# Patient Record
Sex: Female | Born: 1975 | Race: White | Hispanic: No | Marital: Single | Smoking: Current every day smoker
Health system: Southern US, Community
[De-identification: ages and names within clinical notes are randomized; demographics above are authoritative.]

## PROBLEM LIST (undated history)

## (undated) DIAGNOSIS — J301 Allergic rhinitis due to pollen: Secondary | ICD-10-CM

## (undated) DIAGNOSIS — F32A Depression, unspecified: Secondary | ICD-10-CM

## (undated) DIAGNOSIS — F509 Eating disorder, unspecified: Secondary | ICD-10-CM

## (undated) DIAGNOSIS — R011 Cardiac murmur, unspecified: Secondary | ICD-10-CM

## (undated) DIAGNOSIS — H409 Unspecified glaucoma: Secondary | ICD-10-CM

## (undated) DIAGNOSIS — G43909 Migraine, unspecified, not intractable, without status migrainosus: Secondary | ICD-10-CM

## (undated) DIAGNOSIS — F329 Major depressive disorder, single episode, unspecified: Secondary | ICD-10-CM

## (undated) DIAGNOSIS — K219 Gastro-esophageal reflux disease without esophagitis: Secondary | ICD-10-CM

## (undated) DIAGNOSIS — B019 Varicella without complication: Secondary | ICD-10-CM

## (undated) HISTORY — DX: Eating disorder, unspecified: F50.9

## (undated) HISTORY — DX: Cardiac murmur, unspecified: R01.1

## (undated) HISTORY — DX: Unspecified glaucoma: H40.9

## (undated) HISTORY — DX: Varicella without complication: B01.9

## (undated) HISTORY — DX: Depression, unspecified: F32.A

## (undated) HISTORY — DX: Gastro-esophageal reflux disease without esophagitis: K21.9

## (undated) HISTORY — DX: Major depressive disorder, single episode, unspecified: F32.9

## (undated) HISTORY — DX: Allergic rhinitis due to pollen: J30.1

## (undated) HISTORY — PX: EYE SURGERY: SHX253

## (undated) HISTORY — DX: Migraine, unspecified, not intractable, without status migrainosus: G43.909

---
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
## 2014-10-02 ENCOUNTER — Ambulatory Visit
Admission: RE | Admit: 2014-10-02 | Discharge: 2014-10-02 | Disposition: A | Discharge status: Home / Self Care | Payer: 59 | Source: Ambulatory Visit | Attending: Family Medicine | Admitting: Family Medicine

## 2014-10-02 ENCOUNTER — Other Ambulatory Visit: Payer: Self-pay | Admitting: Family Medicine

## 2014-10-02 DIAGNOSIS — M5489 Other dorsalgia: Secondary | ICD-10-CM
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
## 2015-05-24 ENCOUNTER — Emergency Department (HOSPITAL_COMMUNITY): Payer: Worker's Compensation

## 2015-05-24 ENCOUNTER — Emergency Department (HOSPITAL_COMMUNITY)
Admission: EM | Admit: 2015-05-24 | Discharge: 2015-05-25 | Disposition: A | Discharge status: Home / Self Care | Payer: Worker's Compensation | Attending: Emergency Medicine | Admitting: Emergency Medicine

## 2015-05-24 ENCOUNTER — Encounter (HOSPITAL_COMMUNITY): Payer: Self-pay | Admitting: *Deleted

## 2015-05-24 DIAGNOSIS — W11XXXA Fall on and from ladder, initial encounter: Secondary | ICD-10-CM | POA: Insufficient documentation

## 2015-05-24 DIAGNOSIS — S29002A Unspecified injury of muscle and tendon of back wall of thorax, initial encounter: Secondary | ICD-10-CM | POA: Insufficient documentation

## 2015-05-24 DIAGNOSIS — S4992XA Unspecified injury of left shoulder and upper arm, initial encounter: Secondary | ICD-10-CM | POA: Insufficient documentation

## 2015-05-24 DIAGNOSIS — Y999 Unspecified external cause status: Secondary | ICD-10-CM | POA: Diagnosis not present

## 2015-05-24 DIAGNOSIS — Z720 Tobacco use: Secondary | ICD-10-CM | POA: Diagnosis not present

## 2015-05-24 DIAGNOSIS — M25512 Pain in left shoulder: Secondary | ICD-10-CM

## 2015-05-24 DIAGNOSIS — Y939 Activity, unspecified: Secondary | ICD-10-CM | POA: Diagnosis not present

## 2015-05-24 DIAGNOSIS — S99911A Unspecified injury of right ankle, initial encounter: Secondary | ICD-10-CM | POA: Insufficient documentation

## 2015-05-24 DIAGNOSIS — Z79899 Other long term (current) drug therapy: Secondary | ICD-10-CM | POA: Insufficient documentation

## 2015-05-24 DIAGNOSIS — M25571 Pain in right ankle and joints of right foot: Secondary | ICD-10-CM

## 2015-05-24 DIAGNOSIS — M62830 Muscle spasm of back: Secondary | ICD-10-CM | POA: Diagnosis not present

## 2015-05-24 DIAGNOSIS — Y929 Unspecified place or not applicable: Secondary | ICD-10-CM | POA: Diagnosis not present

## 2015-05-24 DIAGNOSIS — M546 Pain in thoracic spine: Secondary | ICD-10-CM

## 2015-05-24 DIAGNOSIS — S0990XA Unspecified injury of head, initial encounter: Secondary | ICD-10-CM | POA: Diagnosis present

## 2015-05-24 IMAGING — CR DG ANKLE COMPLETE 3+V*R*
3 series · 3 of 3 positions shown · non-contrast
Comparison: None.

CLINICAL DATA: Fall, twisted right ankle

EXAM:
RIGHT ANKLE - COMPLETE 3+ VIEW

[x ankle ap right]
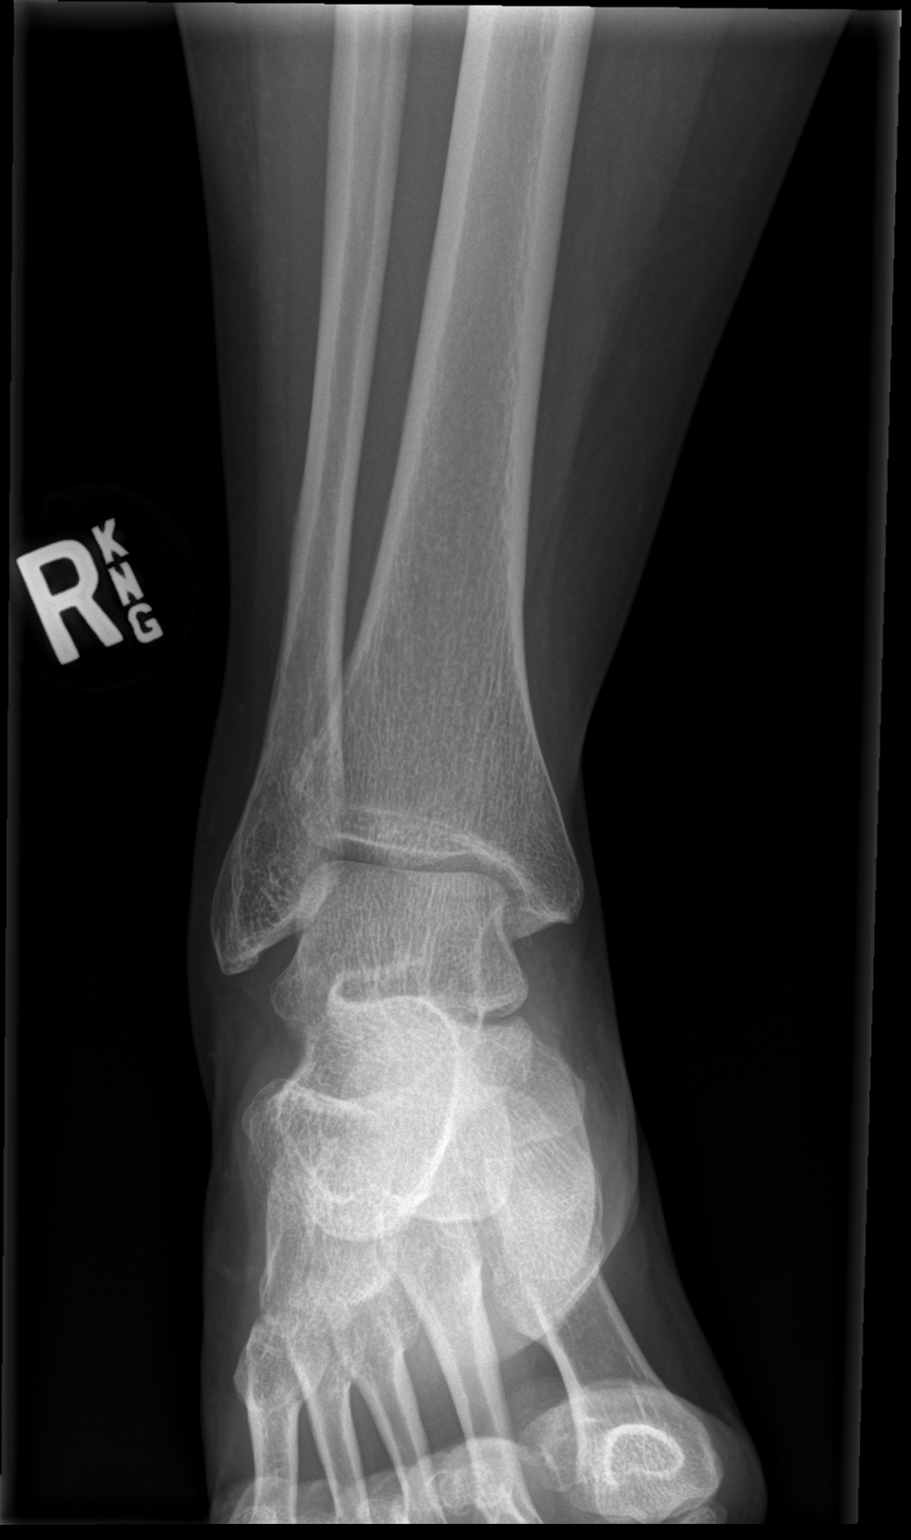

[x ankle obl right]
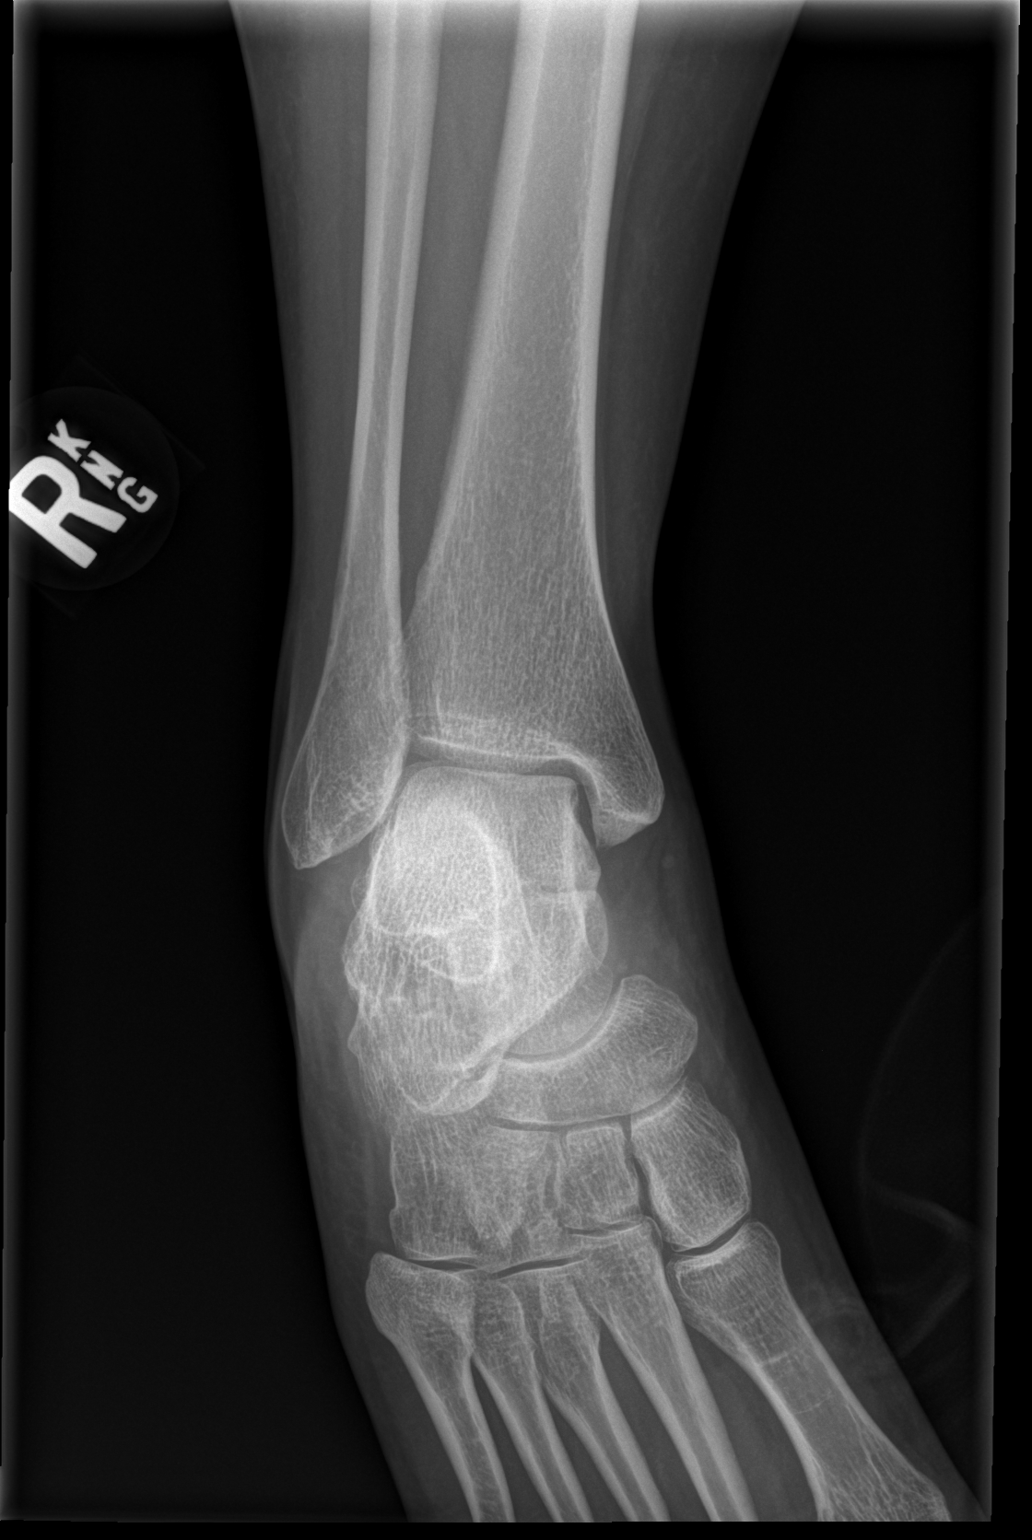

[x ankle lat right]
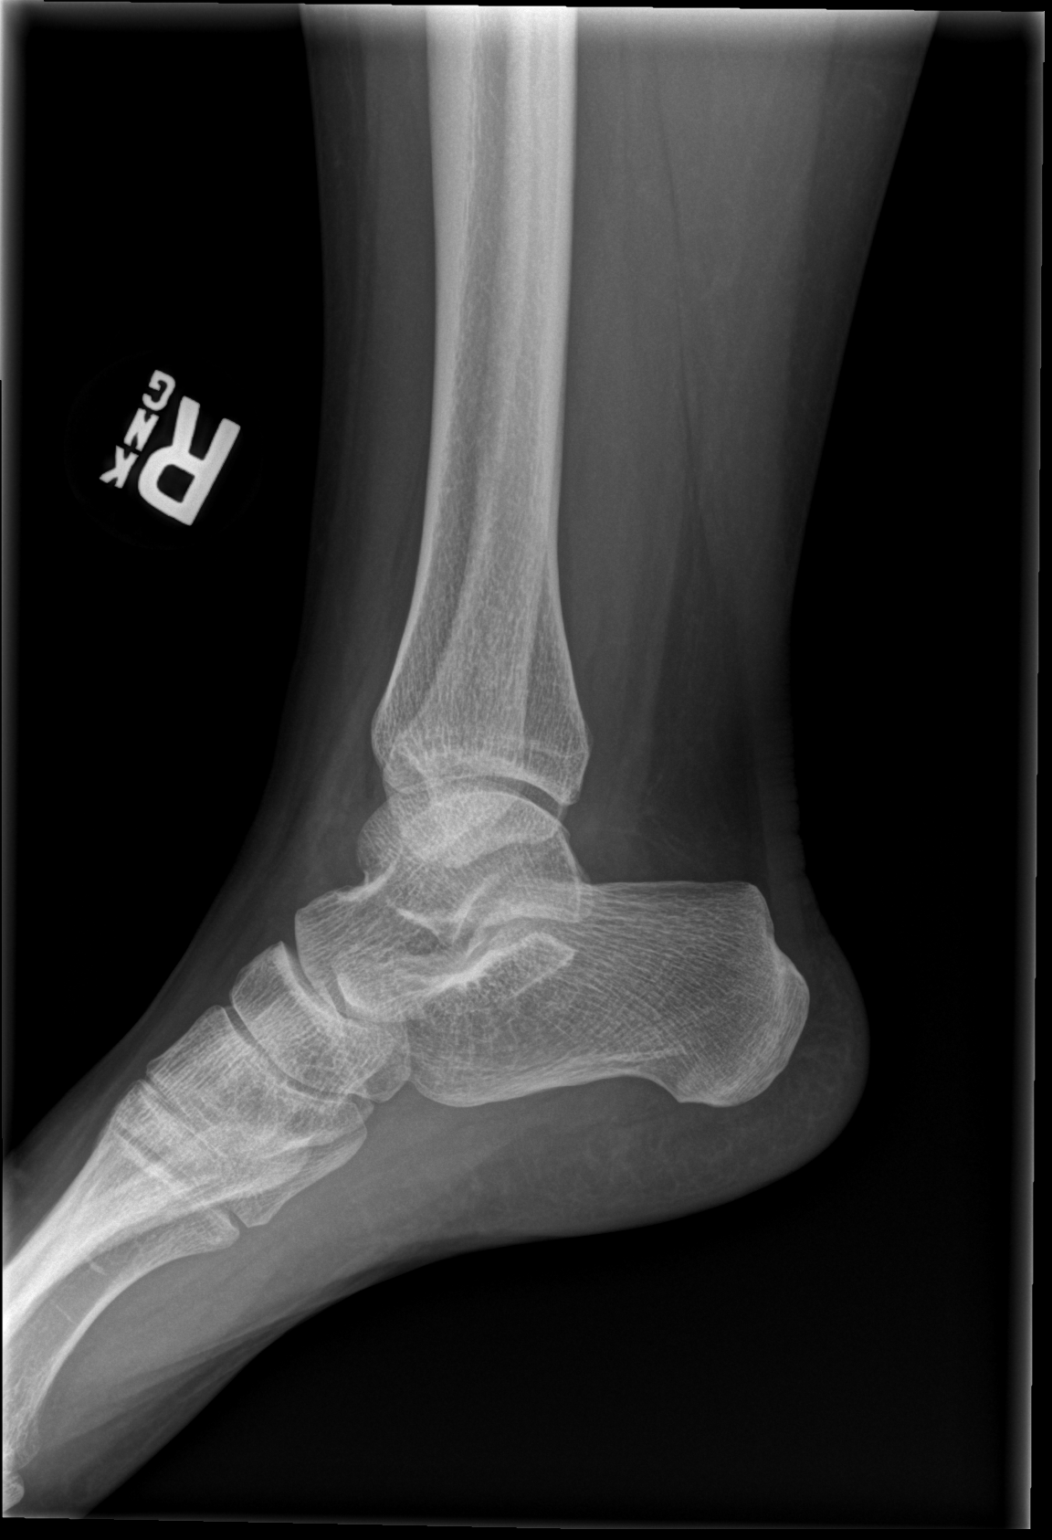

[3 of 3 positions shown; findings below may reference images not displayed]

FINDINGS: No fracture or dislocation is seen.

The ankle mortise is intact.

The base of the fifth metatarsal is unremarkable.

The visualized soft tissues are unremarkable.
IMPRESSION: No fracture or dislocation is seen.

## 2015-05-24 IMAGING — CT CT CERVICAL SPINE W/O CM
4 of 6 series · 13 of 33 positions shown, 15 images · non-contrast
Comparison: None.

CLINICAL DATA: Fall, neck pain

EXAM:
CT HEAD WITHOUT CONTRAST
CT CERVICAL SPINE WITHOUT CONTRAST
TECHNIQUE: Multidetector CT imaging of the head and cervical spine was
performed following the standard protocol without intravenous
contrast. Multiplanar CT image reconstructions of the cervical spine
were also generated.

[Series 3: c-spine st · axial · 0.29mm/px · z∈[-233,-181]mm · 2 of 79 slices shown]
[im 27/79  bone]
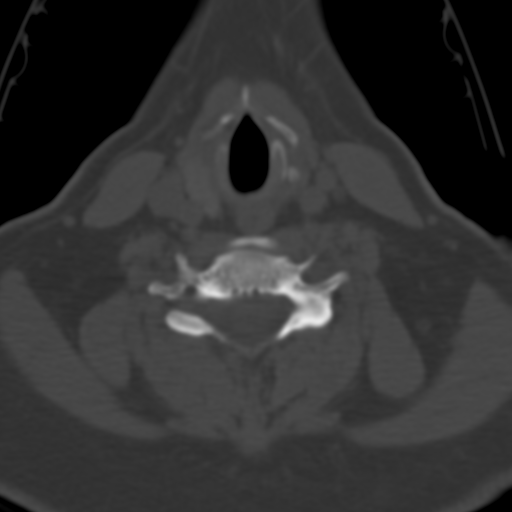
[im 53/79  bone]
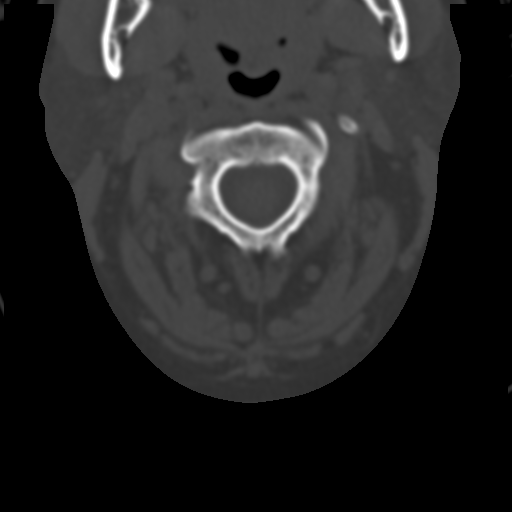

[Series 8: axial recon · axial · 0.23mm/px · z∈[-271,-193]mm · 3 of 85 slices shown, 4 images]
[im 22/85  soft-tissue]
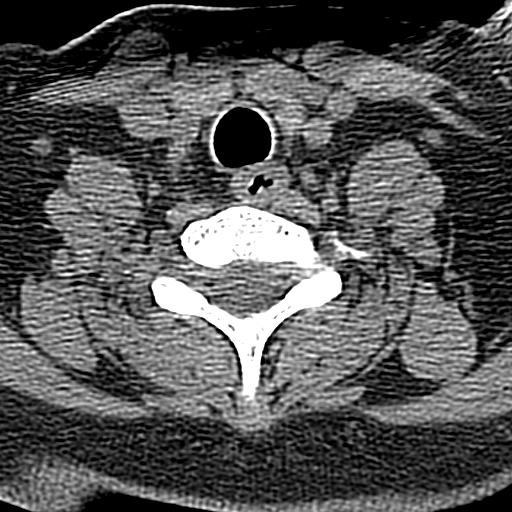
[im 22/85  bone]
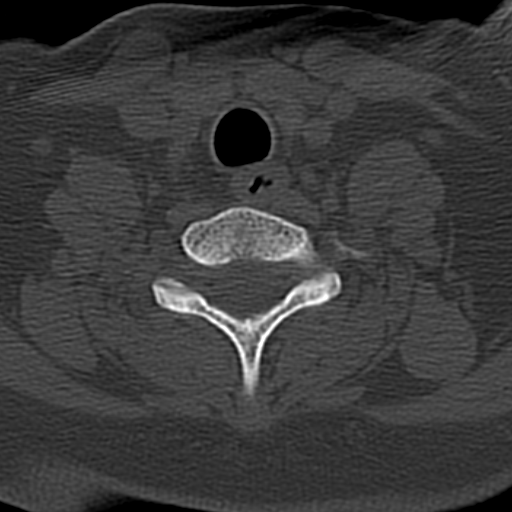
[im 43/85  bone]
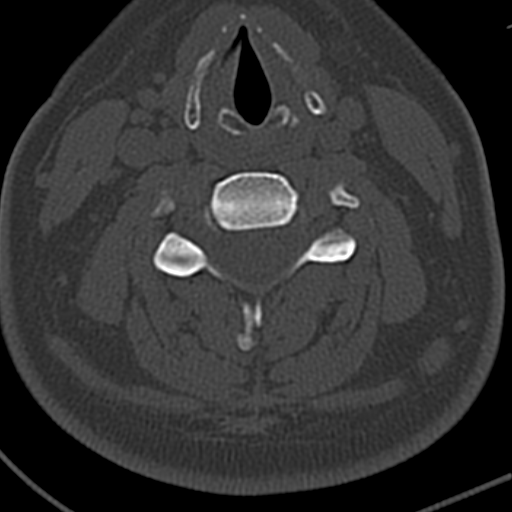
[im 64/85  bone]
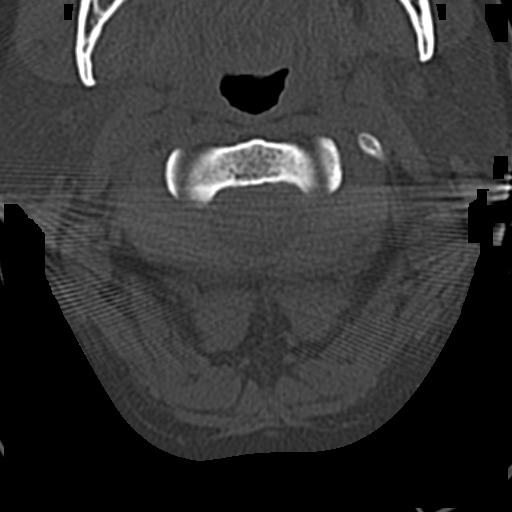

[Series 9: coronal · coronal · 0.25mm/px · 3 of 61 slices shown]
[im 13/61  bone]
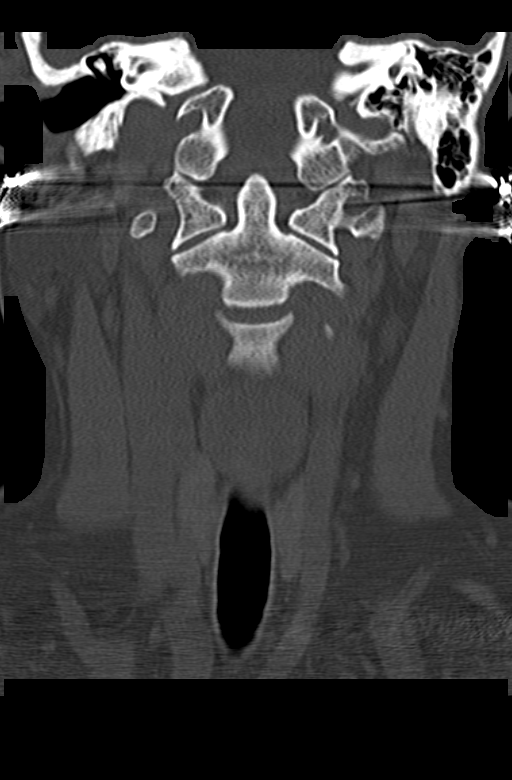
[im 25/61  bone]
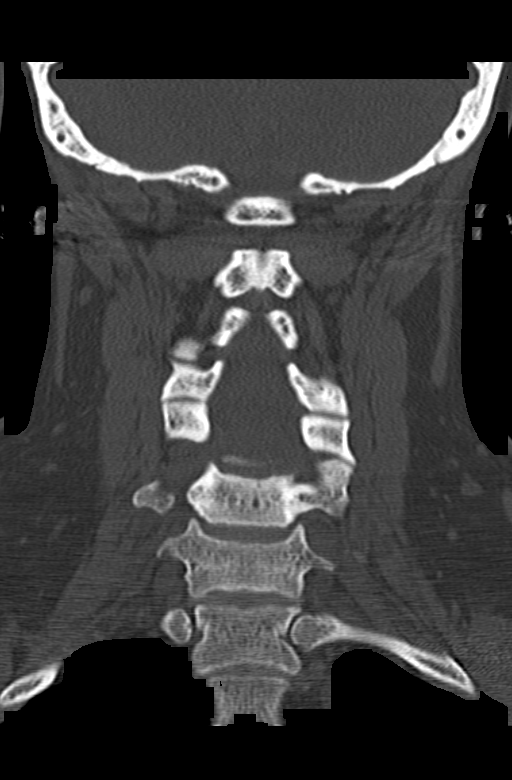
[im 37/61  bone]
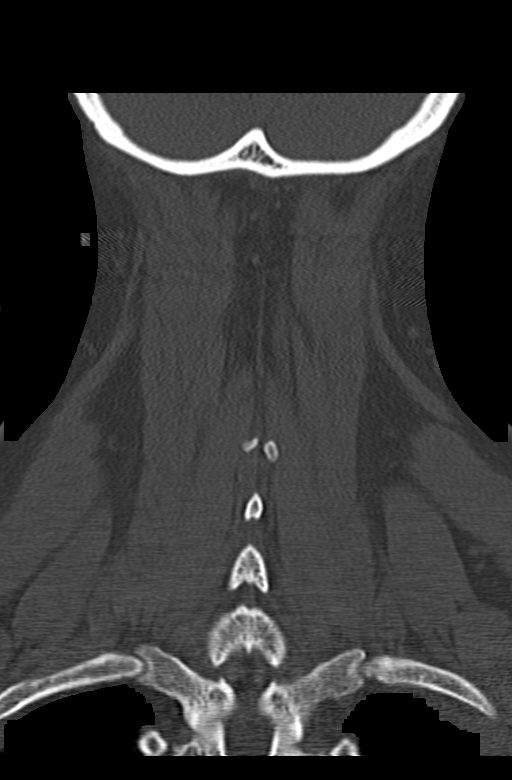

[Series 10: sagittal · sagittal · 0.26mm/px · 5 of 61 slices shown, 6 images]
[im 21/61  bone]
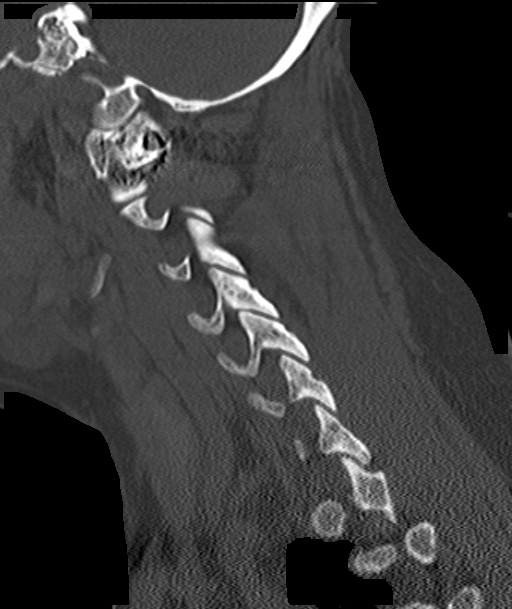
[im 26/61  bone]
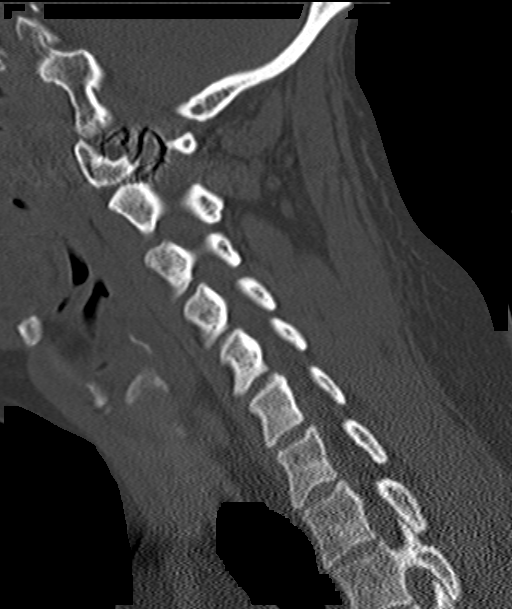
[im 31/61  soft-tissue]
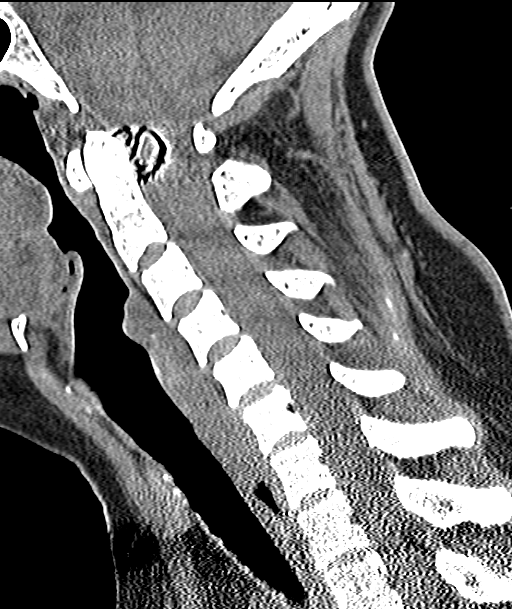
[im 31/61  bone]
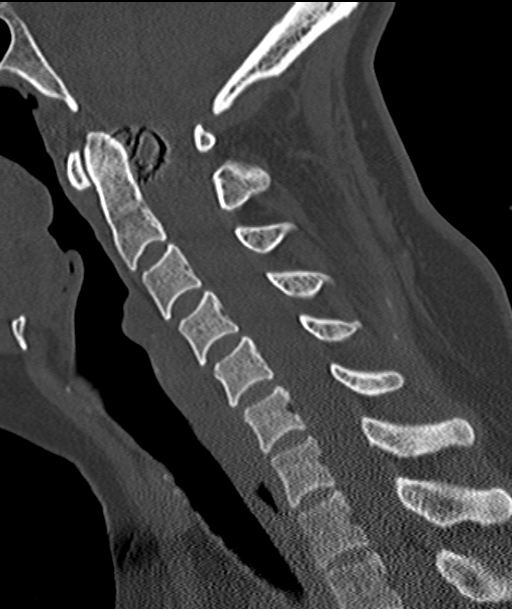
[im 36/61  bone]
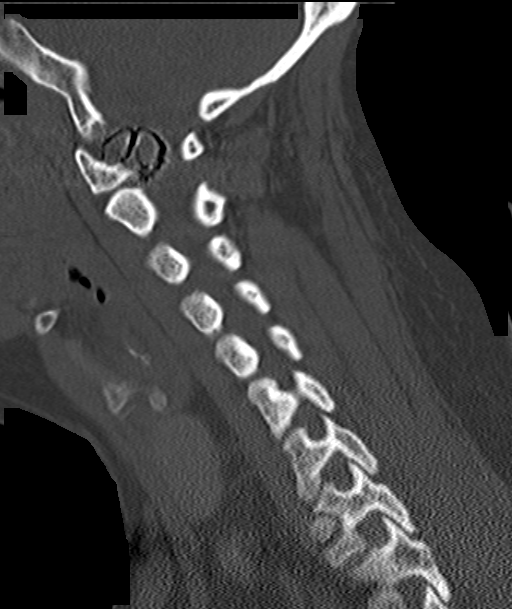
[im 41/61  bone]
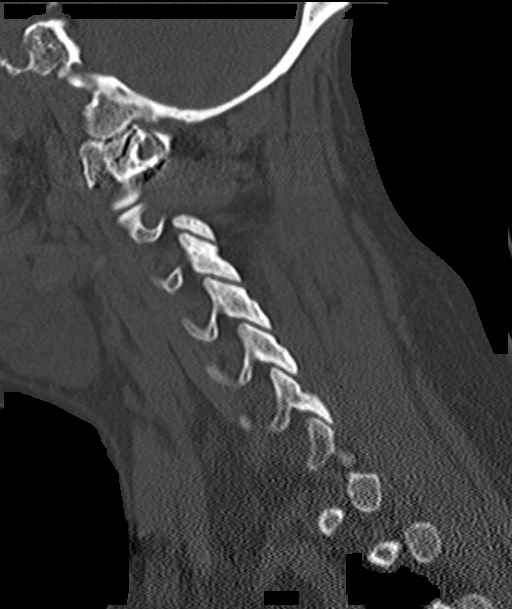

[13 of 33 positions shown; findings below may reference images not displayed]

FINDINGS: CT HEAD FINDINGS

No evidence of parenchymal hemorrhage or extra-axial fluid
collection. No mass lesion, mass effect, or midline shift.

No CT evidence of acute infarction.

Cerebral volume is within normal limits.  No ventriculomegaly.

Right globe prosthesis.

The visualized paranasal sinuses are essentially clear. The mastoid
air cells are unopacified.

No evidence of calvarial fracture.

CT CERVICAL SPINE FINDINGS

Straightening of the cervical spine.

No evidence of fracture or dislocation. Vertebral body heights and
intervertebral disc spaces are maintained. Dens appears intact.

No prevertebral soft tissue swelling.

Visualized thyroid is unremarkable.

Visualized lung apices are clear.
IMPRESSION: Right globe prosthesis.  Otherwise negative head CT.

Normal cervical spine CT.

## 2015-05-24 IMAGING — CR DG SHOULDER 2+V*L*
3 series · 3 of 3 positions shown · non-contrast
Comparison: None.

CLINICAL DATA: Fall

EXAM:
LEFT SHOULDER - 2+ VIEW

[x shoulder ap left (1 of 2)]
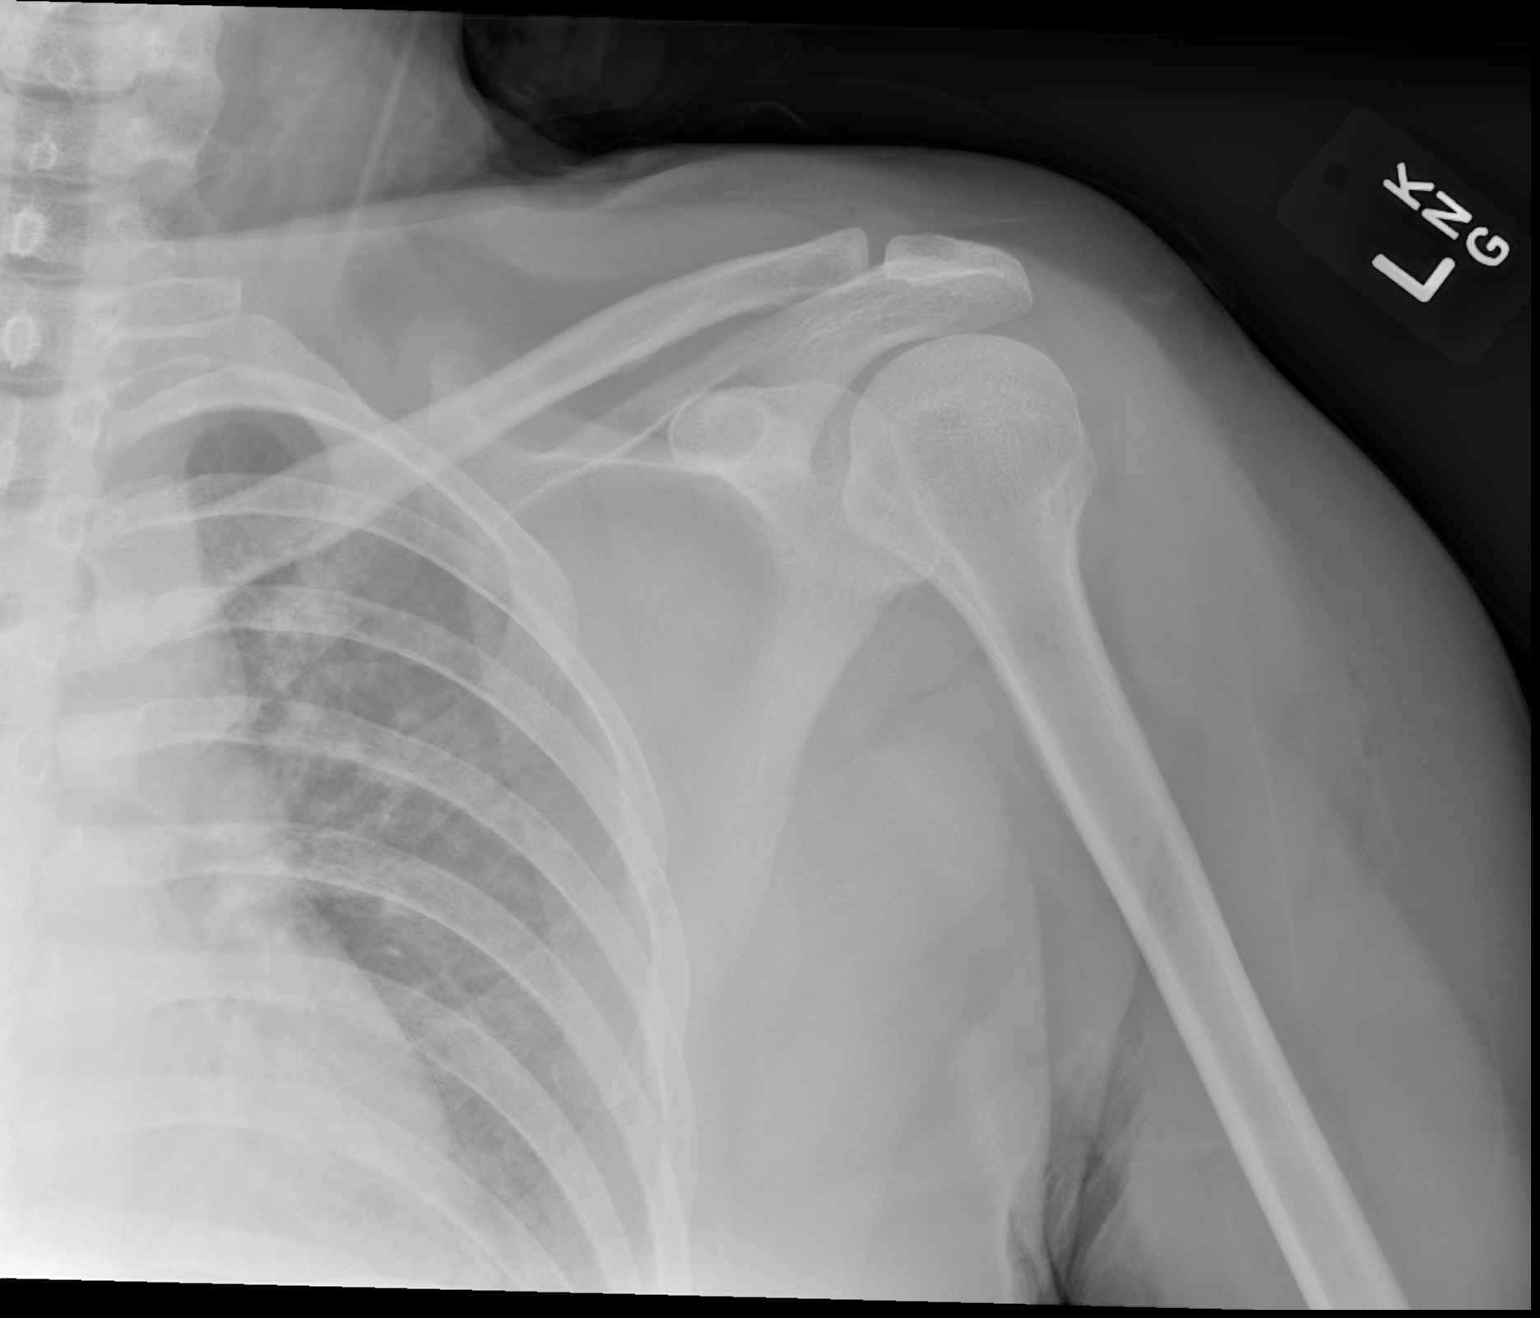

[x shoulder ap left (2 of 2)]
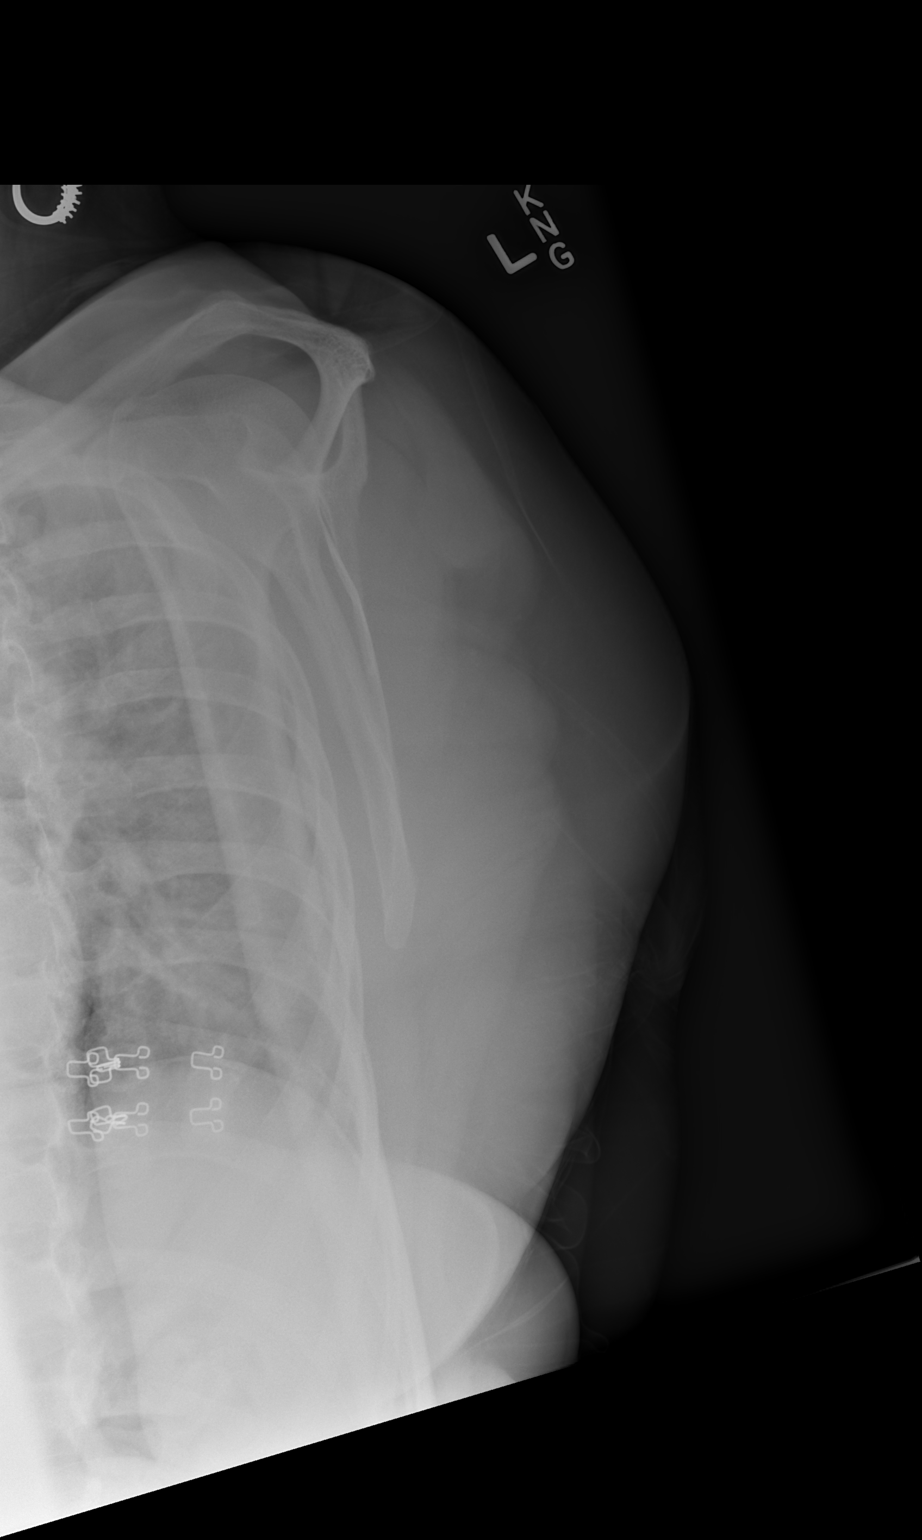

[x shoulder axillary left]
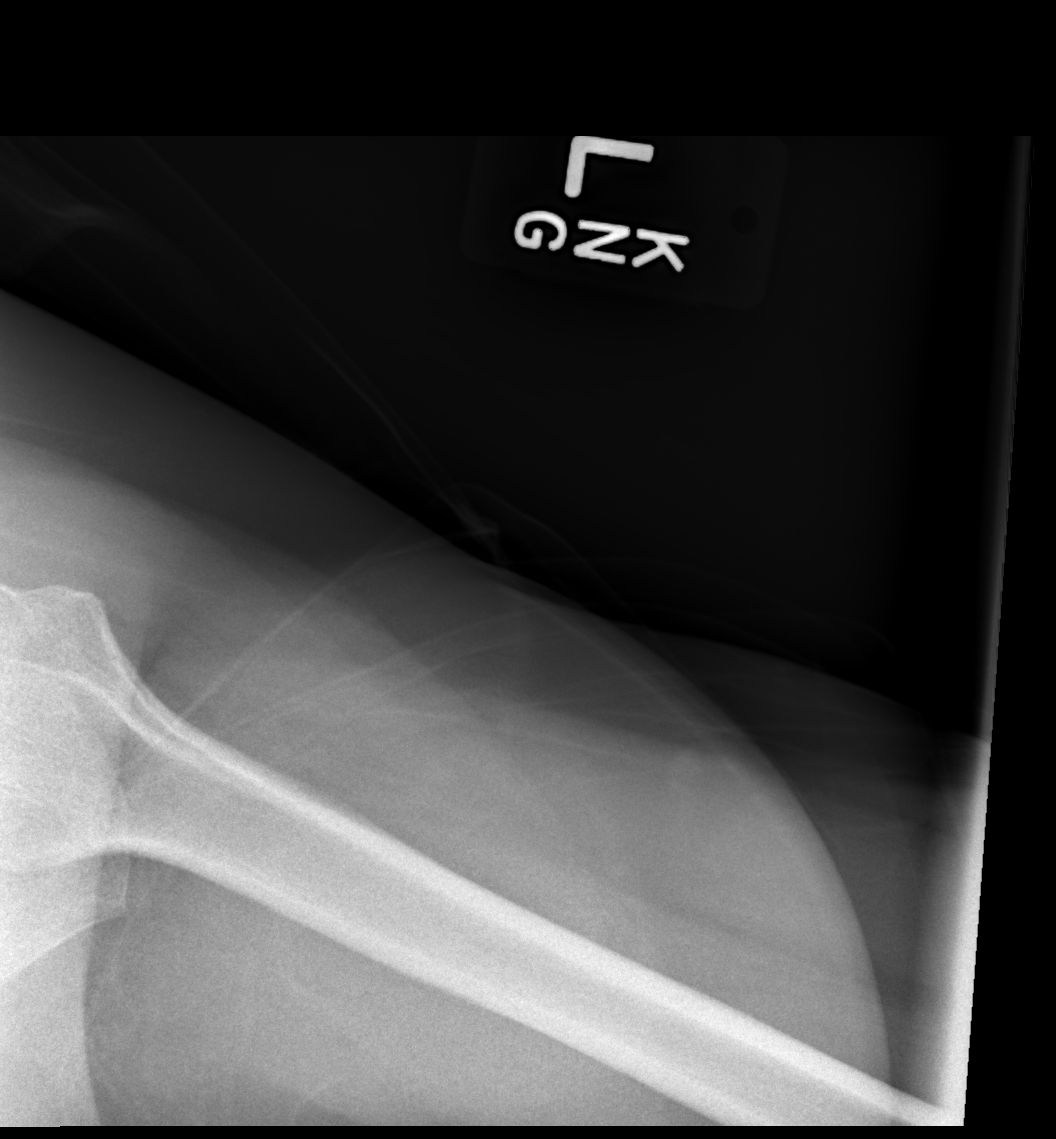

[3 of 3 positions shown; findings below may reference images not displayed]

FINDINGS: No fracture or dislocation is seen.

The joint spaces are preserved.

The visualized soft tissues are unremarkable.

Visualized left lung is essentially clear.
IMPRESSION: No fracture or dislocation is seen.

## 2015-05-24 MED ORDER — MORPHINE SULFATE 4 MG/ML IJ SOLN
4.0000 mg | Freq: Once | INTRAMUSCULAR | Status: AC
  Administered 2015-05-24: 4 mg via INTRAMUSCULAR
  Filled 2015-05-24: qty 1
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
## 2015-05-24 NOTE — ED Notes (Signed)
Bed: [facility] Expected date:  Expected time:  Means of arrival:  Comments: EMS/2F/fall/LSB

## 2015-05-24 NOTE — ED Notes (Signed)
Pt fell backwards when she misstepped about a foot. Pt with complaints of a "twisted right ankle and lt. Hip pain" to EMS. Pt was at work when she fell. Pt arrived to the ER on a back board and with cervical collar in place. Pt placed on stretcher by RN, NT x2 and ortho tech. During RN exam at transfer off of backboard, pt complained of cervical and mid back pain.

## 2015-05-24 NOTE — ED Notes (Addendum)
Pt states that she has pain to the neck, mid back and left arm, lt hip and right ankle.

## 2015-05-25 ENCOUNTER — Emergency Department (HOSPITAL_COMMUNITY): Payer: Worker's Compensation

## 2015-05-25 IMAGING — CR DG THORACIC SPINE 2V
2 series · 2 of 2 positions shown · non-contrast
Comparison: None.

CLINICAL DATA: Fall, back pain

EXAM:
THORACIC SPINE - 2 VIEW

[t thoracic spine ap]
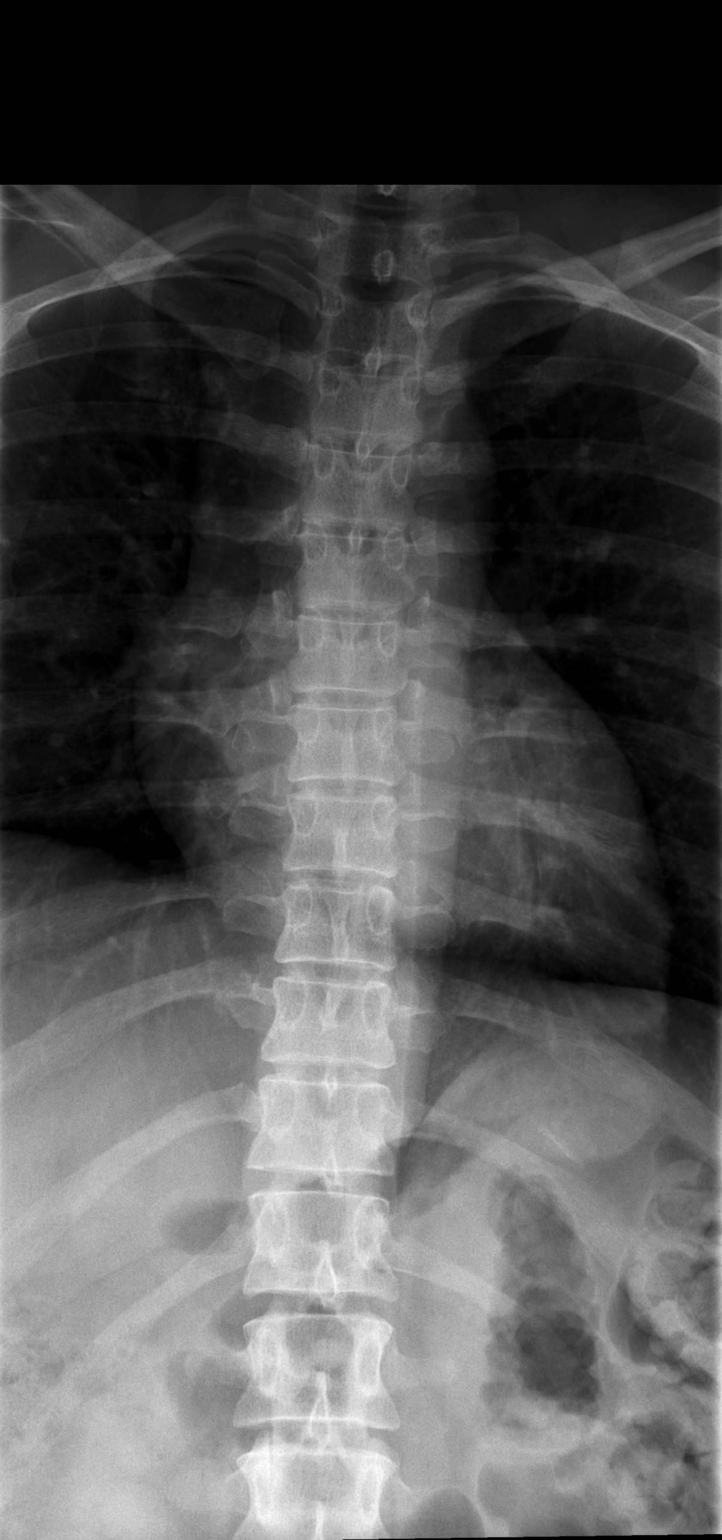

[t thoracic spine lat]
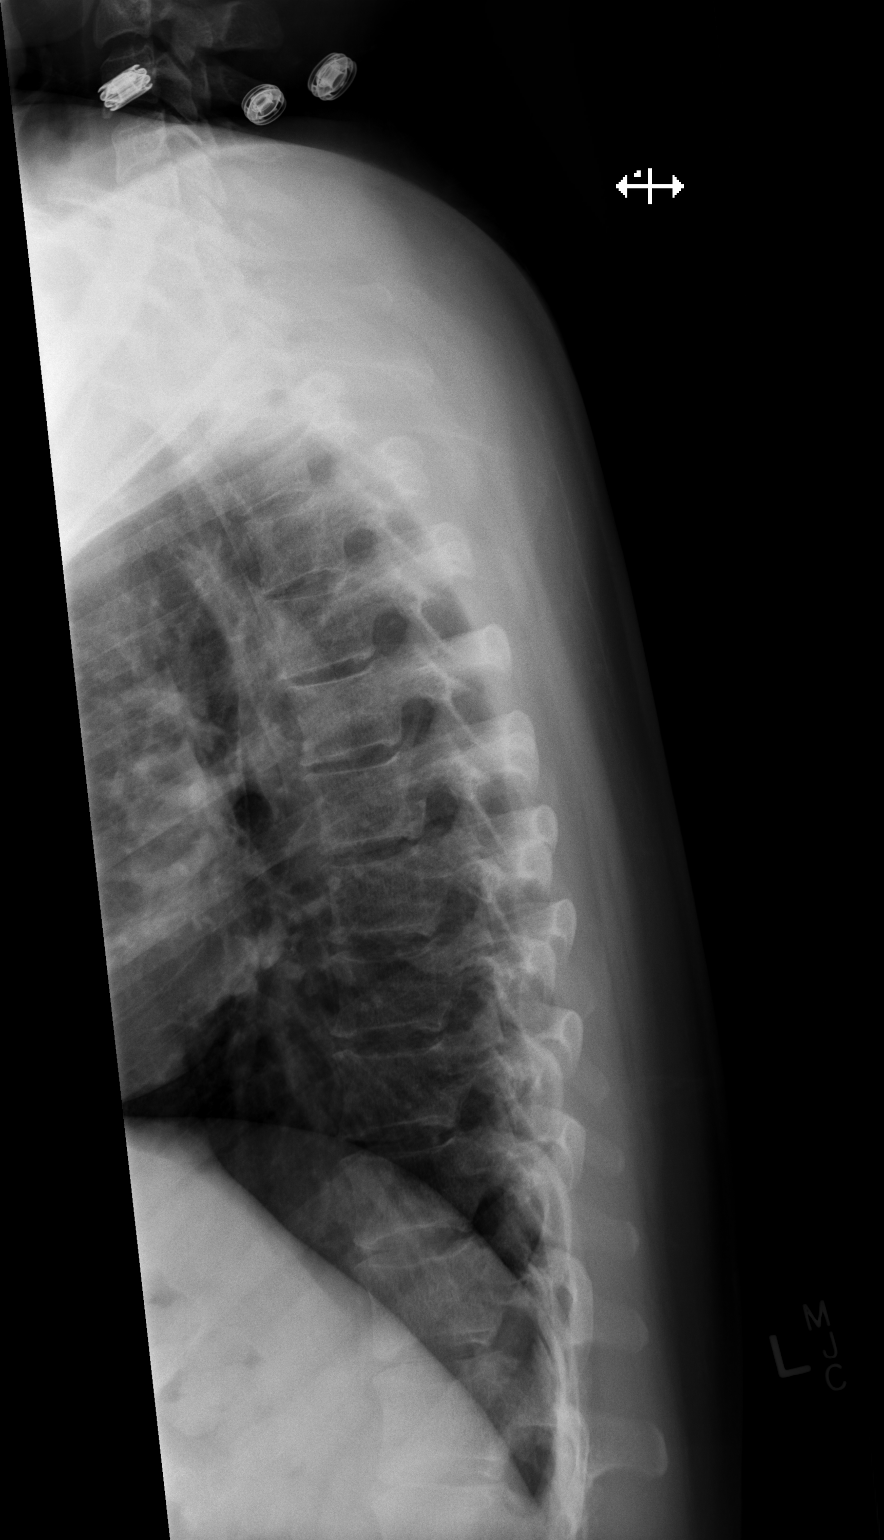

[2 of 2 positions shown; findings below may reference images not displayed]

FINDINGS: Normal thoracic kyphosis.

No evidence of fracture or dislocation. Vertebral body heights and
intervertebral disc spaces are maintained.

Visualized lungs are clear.
IMPRESSION: No fracture or dislocation is seen.

## 2015-05-25 MED ORDER — HYDROCODONE-ACETAMINOPHEN 5-325 MG PO TABS: 2.0000 | ORAL_TABLET | ORAL | Status: DC | PRN

## 2015-05-25 MED ORDER — DIAZEPAM 2 MG PO TABS
2.0000 mg | ORAL_TABLET | Freq: Once | ORAL | Status: AC
  Administered 2015-05-25: 2 mg via ORAL
  Filled 2015-05-25: qty 1

## 2015-05-25 MED ORDER — DIAZEPAM 5 MG/ML IJ SOLN: 2.5000 mg | Freq: Once | INTRAMUSCULAR | Status: DC

## 2015-05-25 MED ORDER — IBUPROFEN 800 MG PO TABS: 800.0000 mg | ORAL_TABLET | Freq: Three times a day (TID) | ORAL | Status: AC

## 2015-05-25 MED ORDER — CYCLOBENZAPRINE HCL 10 MG PO TABS: 10.0000 mg | ORAL_TABLET | Freq: Two times a day (BID) | ORAL | Status: DC | PRN

## 2015-05-25 NOTE — ED Notes (Signed)
Pt was given morphine prior to giving urine specimen for her drug screening.

## 2015-05-25 NOTE — Discharge Instructions (Signed)
Ankle Pain  Ankle pain is a common symptom. The bones, cartilage, tendons, and muscles of the ankle joint perform a lot of work each day. The ankle joint holds your body weight and allows you to move around. Ankle pain can occur on either side or back of 1 or both ankles. Ankle pain may be sharp and burning or dull and aching. There may be tenderness, stiffness, redness, or warmth around the ankle. The pain occurs more often when a person walks or puts pressure on the ankle.  CAUSES   There are many reasons ankle pain can develop. It is important to work with your caregiver to identify the cause since many conditions can impact the bones, cartilage, muscles, and tendons. Causes for ankle pain include:   Injury, including a break (fracture), sprain, or strain often due to a fall, sports, or a high-impact activity.   Swelling (inflammation) of a tendon (tendonitis).   Achilles tendon rupture.   Ankle instability after repeated sprains and strains.   Poor foot alignment.   Pressure on a nerve (tarsal tunnel syndrome).   Arthritis in the ankle or the lining of the ankle.   Crystal formation in the ankle (gout or pseudogout).  DIAGNOSIS   A diagnosis is based on your medical history, your symptoms, results of your physical exam, and results of diagnostic tests. Diagnostic tests may include X-ray exams or a computerized magnetic scan (magnetic resonance imaging, MRI).  TREATMENT   Treatment will depend on the cause of your ankle pain and may include:   Keeping pressure off the ankle and limiting activities.   Using crutches or other walking support (a cane or brace).   Using rest, ice, compression, and elevation.   Participating in physical therapy or home exercises.   Wearing shoe inserts or special shoes.   Losing weight.   Taking medications to reduce pain or swelling or receiving an injection.   Undergoing surgery.  HOME CARE INSTRUCTIONS    Only take over-the-counter or prescription medicines for  pain, discomfort, or fever as directed by your caregiver.   Put ice on the injured area.   Put ice in a plastic bag.   Place a towel between your skin and the bag.   Leave the ice on for 15-20 minutes at a time, 03-04 times a day.   Keep your leg raised (elevated) when possible to lessen swelling.   Avoid activities that cause ankle pain.   Follow specific exercises as directed by your caregiver.   Record how often you have ankle pain, the location of the pain, and what it feels like. This information may be helpful to you and your caregiver.   Ask your caregiver about returning to work or sports and whether you should drive.   Follow up with your caregiver for further examination, therapy, or testing as directed.  SEEK MEDICAL CARE IF:    Pain or swelling continues or worsens beyond 1 week.   You have an oral temperature above 102 F (38.9 C).   You are feeling unwell or have chills.   You are having an increasingly difficult time with walking.   You have loss of sensation or other new symptoms.   You have questions or concerns.  MAKE SURE YOU:    Understand these instructions.   Will watch your condition.   Will get help right away if you are not doing well or get worse.  Document Released: 05/06/2010 Document Revised: 02/08/2012 Document Reviewed: 05/06/2010  ExitCare   Patient Information 2015 [employer]. This information is not intended to replace advice given to you by your health care provider. Make sure you discuss any questions you have with your health care provider.  Shoulder Pain  The shoulder is the joint that connects your arms to your body. The bones that form the shoulder joint include the upper arm bone (humerus), the shoulder blade (scapula), and the collarbone (clavicle). The top of the humerus is shaped like a ball and fits into a rather flat socket on the scapula (glenoid cavity). A combination of muscles and strong, fibrous tissues that connect muscles to bones (tendons)  support your shoulder joint and hold the ball in the socket. Small, fluid-filled sacs (bursae) are located in different areas of the joint. They act as cushions between the bones and the overlying soft tissues and help reduce friction between the gliding tendons and the bone as you move your arm. Your shoulder joint allows a wide range of motion in your arm. This range of motion allows you to do things like scratch your back or throw a ball. However, this range of motion also makes your shoulder more prone to pain from overuse and injury.  Causes of shoulder pain can originate from both injury and overuse and usually can be grouped in the following four categories:   Redness, swelling, and pain (inflammation) of the tendon (tendinitis) or the bursae (bursitis).   Instability, such as a dislocation of the joint.   Inflammation of the joint (arthritis).   Broken bone (fracture).  HOME CARE INSTRUCTIONS    Apply ice to the sore area.   Put ice in a plastic bag.   Place a towel between your skin and the bag.   Leave the ice on for 15-20 minutes, 3-4 times per day for the first 2 days, or as directed by your health care provider.   Stop using cold packs if they do not help with the pain.   If you have a shoulder sling or immobilizer, wear it as long as your caregiver instructs. Only remove it to shower or bathe. Move your arm as little as possible, but keep your hand moving to prevent swelling.   Squeeze a soft ball or foam pad as much as possible to help prevent swelling.   Only take over-the-counter or prescription medicines for pain, discomfort, or fever as directed by your caregiver.  SEEK MEDICAL CARE IF:    Your shoulder pain increases, or new pain develops in your arm, hand, or fingers.   Your hand or fingers become cold and numb.   Your pain is not relieved with medicines.  SEEK IMMEDIATE MEDICAL CARE IF:    Your arm, hand, or fingers are numb or tingling.   Your arm, hand, or fingers are  significantly swollen or turn white or blue.  MAKE SURE YOU:    Understand these instructions.   Will watch your condition.   Will get help right away if you are not doing well or get worse.  Document Released: 08/26/2005 Document Revised: 04/02/2014 Document Reviewed: 10/31/2011  ExitCare Patient Information 2015 [employer]. This information is not intended to replace advice given to you by your health care provider. Make sure you discuss any questions you have with your health care provider.

## 2015-05-25 NOTE — ED Notes (Signed)
Unable to complete urine drug screen on pt as we do not have the supplies necessary to preform test.  Pt will be given valium prior to departure.  If pt has drug screening done tomorrow pt will be positive for benzodiazepines and opiates d/t medications given in ED tonight.

## 2015-12-19 ENCOUNTER — Other Ambulatory Visit (HOSPITAL_COMMUNITY)
Admission: RE | Admit: 2015-12-19 | Discharge: 2015-12-19 | Disposition: A | Discharge status: Home / Self Care | Payer: Self-pay | Source: Ambulatory Visit | Attending: Family Medicine | Admitting: Family Medicine

## 2015-12-19 ENCOUNTER — Other Ambulatory Visit: Payer: Self-pay | Admitting: Family Medicine

## 2015-12-19 DIAGNOSIS — Z124 Encounter for screening for malignant neoplasm of cervix: Secondary | ICD-10-CM | POA: Insufficient documentation

## 2015-12-23 LAB — CYTOLOGY - PAP

## 2017-07-27 DIAGNOSIS — R109 Unspecified abdominal pain: Secondary | ICD-10-CM | POA: Diagnosis not present

## 2017-07-27 DIAGNOSIS — G43909 Migraine, unspecified, not intractable, without status migrainosus: Secondary | ICD-10-CM | POA: Diagnosis not present

## 2017-07-27 DIAGNOSIS — E7800 Pure hypercholesterolemia, unspecified: Secondary | ICD-10-CM | POA: Diagnosis not present

## 2017-08-19 DIAGNOSIS — G43909 Migraine, unspecified, not intractable, without status migrainosus: Secondary | ICD-10-CM | POA: Diagnosis not present

## 2017-09-24 DIAGNOSIS — S060X0D Concussion without loss of consciousness, subsequent encounter: Secondary | ICD-10-CM | POA: Diagnosis not present

## 2017-09-24 DIAGNOSIS — Z23 Encounter for immunization: Secondary | ICD-10-CM | POA: Diagnosis not present

## 2018-01-25 DIAGNOSIS — R51 Headache: Secondary | ICD-10-CM | POA: Diagnosis not present

## 2018-01-25 DIAGNOSIS — N926 Irregular menstruation, unspecified: Secondary | ICD-10-CM | POA: Diagnosis not present

## 2018-01-28 ENCOUNTER — Telehealth: Payer: Self-pay
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
## 2018-02-01 ENCOUNTER — Other Ambulatory Visit (INDEPENDENT_AMBULATORY_CARE_PROVIDER_SITE_OTHER): Payer: BLUE CROSS/BLUE SHIELD

## 2018-02-01 ENCOUNTER — Ambulatory Visit: Payer: BLUE CROSS/BLUE SHIELD | Admitting: Family Medicine

## 2018-02-01 ENCOUNTER — Encounter: Payer: Self-pay | Admitting: Family Medicine

## 2018-02-01 VITALS — BP 130/102 | HR 65 | Ht 60.25 in | Wt 145.0 lb

## 2018-02-01 DIAGNOSIS — M2550 Pain in unspecified joint: Secondary | ICD-10-CM

## 2018-02-01 DIAGNOSIS — R519 Headache, unspecified: Secondary | ICD-10-CM | POA: Insufficient documentation

## 2018-02-01 DIAGNOSIS — G8929 Other chronic pain: Secondary | ICD-10-CM | POA: Insufficient documentation

## 2018-02-01 DIAGNOSIS — R51 Headache: Secondary | ICD-10-CM | POA: Diagnosis not present

## 2018-02-01 LAB — CBC WITH DIFFERENTIAL/PLATELET
Basophils Absolute: 0 10*3/uL (ref 0.0–0.1)
Basophils Relative: 0.5 % (ref 0.0–3.0)
Eosinophils Absolute: 0.1 10*3/uL (ref 0.0–0.7)
Eosinophils Relative: 2 % (ref 0.0–5.0)
HCT: 41.4 % (ref 36.0–46.0)
Hemoglobin: 13.8 g/dL (ref 12.0–15.0)
Lymphocytes Relative: 31.5 % (ref 12.0–46.0)
Lymphs Abs: 2 10*3/uL (ref 0.7–4.0)
MCHC: 33.2 g/dL (ref 30.0–36.0)
MCV: 95.5 fl (ref 78.0–100.0)
Monocytes Absolute: 0.6 10*3/uL (ref 0.1–1.0)
Monocytes Relative: 8.5 % (ref 3.0–12.0)
Neutro Abs: 3.7 10*3/uL (ref 1.4–7.7)
Neutrophils Relative %: 57.5 % (ref 43.0–77.0)
Platelets: 251 10*3/uL (ref 150.0–400.0)
RBC: 4.34 Mil/uL (ref 3.87–5.11)
RDW: 13.6 % (ref 11.5–15.5)
WBC: 6.5 10*3/uL (ref 4.0–10.5)

## 2018-02-01 LAB — VITAMIN B12: Vitamin B-12: 220 pg/mL (ref 211–911)

## 2018-02-01 LAB — COMPREHENSIVE METABOLIC PANEL
ALT: 18 U/L (ref 0–35)
AST: 17 U/L (ref 0–37)
Albumin: 4.5 g/dL (ref 3.5–5.2)
Alkaline Phosphatase: 68 U/L (ref 39–117)
BUN: 13 mg/dL (ref 6–23)
CO2: 22 mEq/L (ref 19–32)
Calcium: 9.4 mg/dL (ref 8.4–10.5)
Chloride: 107 mEq/L (ref 96–112)
Creatinine, Ser: 0.74 mg/dL (ref 0.40–1.20)
GFR: 90.96 mL/min (ref >60.00)
Glucose, Bld: 91 mg/dL (ref 70–99)
Potassium: 3.6 mEq/L (ref 3.5–5.1)
Sodium: 137 mEq/L (ref 135–145)
Total Bilirubin: 0.5 mg/dL (ref 0.2–1.2)
Total Protein: 7.7 g/dL (ref 6.0–8.3)

## 2018-02-01 LAB — C-REACTIVE PROTEIN: CRP: 0.4 mg/dL — ABNORMAL LOW (ref 0.5–20.0)

## 2018-02-01 LAB — FOLATE: Folate: 13.5 ng/mL (ref >5.9)

## 2018-02-01 LAB — VITAMIN D 25 HYDROXY (VIT D DEFICIENCY, FRACTURES): VITD: 10.96 ng/mL — ABNORMAL LOW (ref 30.00–100.00)

## 2018-02-01 LAB — IBC PANEL
Iron: 83 ug/dL (ref 42–145)
Saturation Ratios: 22 % (ref 20.0–50.0)
Transferrin: 269 mg/dL (ref 212.0–360.0)

## 2018-02-01 LAB — URIC ACID: Uric Acid, Serum: 4 mg/dL (ref 2.4–7.0)

## 2018-02-01 LAB — SEDIMENTATION RATE: Sed Rate: 17 mm/hr (ref 0–20)

## 2018-02-01 LAB — TESTOSTERONE: Testosterone: 49.46 ng/dL — ABNORMAL HIGH (ref 15.00–40.00)

## 2018-02-01 LAB — TSH: TSH: 1.54 u[IU]/mL (ref 0.35–4.50)

## 2018-02-01 NOTE — Patient Instructions (Signed)
Good to see you  Vitamin D 2000 IU daily  Turmeric 500mg  twice daily  CoQ10 400mg  daily  Lets get labs and see what comes back  See me again in 2 weeks.  If things get much worse call us or seek medical attentions immediately.  Read about effexor and tell me what you think

## 2018-02-01 NOTE — Assessment & Plan Note (Signed)
Chronic headaches.  Patient settings do seem to be different than patient's history of her migraines.  Discussed with patient in great length.  Discussed icing regimen, we discussed over-the-counter medications.  We discussed laboratory workup to see if anything else is changing in the possibility of a polyarthralgia that she is having as well.  I do feel that there is some underlying psychosomatic issues that is likely contributing as well.  Patient given the opportunity for other medications which she declined at this time.  We have discussed the possibility of advanced imaging if you continue to have difficulties.  Patient is in agreement with this.  Patient knows if any worsening symptoms to call us and seek medical attention immediately.  Follow-up again in 2 weeks

## 2018-02-02 ENCOUNTER — Other Ambulatory Visit: Payer: Self-pay | Admitting: Family Medicine

## 2018-02-02 ENCOUNTER — Telehealth: Payer: Self-pay

## 2018-02-02 ENCOUNTER — Encounter: Payer: Self-pay | Admitting: Family Medicine

## 2018-02-02 ENCOUNTER — Other Ambulatory Visit: Payer: Self-pay

## 2018-02-02 MED ORDER — VITAMIN D (ERGOCALCIFEROL) 1.25 MG (50000 UNIT) PO CAPS: 50000.0000 [IU] | ORAL_CAPSULE | ORAL | 0 refills | Status: DC

## 2018-02-02 NOTE — Telephone Encounter (Signed)
Patient called to [facility]. Called patient to let her know that this correction was made. Patient requests that office visit note from Dr. Katrinka BlazingSmith be sent to Dr. Reola CalkinsBeck.

## 2018-02-03 ENCOUNTER — Encounter: Payer: Self-pay | Admitting: Family Medicine

## 2018-02-03 LAB — ANGIOTENSIN CONVERTING ENZYME: Angiotensin-Converting Enzyme: 28 U/L (ref 9–67)

## 2018-02-03 LAB — RHEUMATOID FACTOR: Rhuematoid fact SerPl-aCnc: <14 IU/mL (ref <14)

## 2018-02-03 LAB — PTH, INTACT AND CALCIUM
Calcium: 9.5 mg/dL (ref 8.6–10.2)
PTH: 59 pg/mL (ref 14–64)

## 2018-02-03 LAB — CYCLIC CITRUL PEPTIDE ANTIBODY, IGG: Cyclic Citrullin Peptide Ab: <16 UNITS

## 2018-02-03 LAB — CALCIUM, IONIZED: Calcium, Ion: 5.3 mg/dL (ref 4.8–5.6)

## 2018-02-03 LAB — ANA: Anti Nuclear Antibody(ANA): NEGATIVE

## 2018-02-08 ENCOUNTER — Encounter: Payer: Self-pay | Admitting: Family Medicine

## 2018-02-08 MED ORDER — VENLAFAXINE HCL ER 37.5 MG PO CP24: 37.5000 mg | ORAL_CAPSULE | Freq: Every day | ORAL | 1 refills | Status: DC

## 2018-02-09 ENCOUNTER — Telehealth: Payer: Self-pay

## 2018-02-09 NOTE — Telephone Encounter (Signed)
Spoke with patient. She began Vitamin D last week and felt good the next day. She was relaxing last week as it was Spring Break. She is back to school this week and has developed a headache. She also feels nauseous and has body aches. Patient asks if she can take prescription strength Aleve with Effexor. Per a verbal from Dr. Katrinka BlazingSmith patient is to try taking Aleve for headaches and Effexor can be taken tonight. If she is feeling bad tomorrow she should discontinue the effexor. Patient has appoint that she will keep for next week. Patient voices understanding.

## 2018-02-15 NOTE — Progress Notes (Signed)
Tawana ScaleZach Shaleah Nissley D.O. [facility] [address] [place], [place] [address] Phone: [phone] Subjective:      CC: Chronic headaches  BJY:[id number]:Subjective  Nichole Evans is a 42 y.o. female coming in with complaint of chronic headache.  Patient has had multiple head injuries.  Has had a constant headache since February 22.  Started with conservative therapy including medications.  Found to have severe low vitamin D.  Patient was on spring break and was feeling better and then restarted school and developed headaches again.  Patient was to take Effexor and see how patient responded.  Patient states that she has been back to class and is doing ok. She has been having headaches but the intensity is less than before. She is taking vitamin d but discontinued effexor due to nausea.      Past Medical History:  Diagnosis Date  . Chicken pox   . Depression   . Eating disorder   . GERD (gastroesophageal reflux disease)   . Glaucoma   . Hay fever   . Heart murmur   . Migraines    Past Surgical History:  Procedure Laterality Date  . EYE SURGERY     rt eye removed  . right eye removal  1982   Social History   Socioeconomic History  . Marital status: Single    Spouse name: Not on file  . Number of children: Not on file  . Years of education: Not on file  . Highest education level: Not on file  Social Needs  . Financial resource strain: Not on file  . Food insecurity - worry: Not on file  . Food insecurity - inability: Not on file  . Transportation needs - medical: Not on file  . Transportation needs - non-medical: Not on file  Occupational History  . Not on file  Tobacco Use  . Smoking status: Current Every Day Smoker    Packs/day: 0.50    Types: Cigarettes  Substance and Sexual Activity  . Alcohol use: Yes    Alcohol/week: 1.2 oz    Types: 2 Glasses of wine per week  . Drug use: No  . Sexual activity: Not on file  Other Topics Concern  . Not on file  Social History  Narrative  . Not on file   Allergies  Allergen Reactions  . Ceclor [Cefaclor] Hives   No family history on file.  No family history of autoimmune disease   Past medical history, social, surgical and family history all reviewed in electronic medical record.  No pertanent information unless stated regarding to the chief complaint.   Review of Systems:Review of systems updated and as accurate as of 02/16/18  No  visual changes, nausea, vomiting, diarrhea, constipation, dizziness, abdominal pain, skin rash, fevers, chills, night sweats, weight loss, swollen lymph nodes, body aches, joint swelling, , chest pain, shortness of breath, mood changes.  Positive headaches and muscle aches  Objective  Blood pressure 112/88, pulse 80, height 5' (1.524 m), weight 141 lb (64 kg), SpO2 98 %. Systems examined below as of 02/16/18   General: No apparent distress alert and oriented x3 mood and affect normal, dressed appropriately.  HEENT: Glasses are noted on the right, extraocular movements intact  Respiratory: Patient's speak in full sentences and does not appear short of breath  Cardiovascular: No lower extremity edema, non tender, no erythema  Skin: Warm dry intact with no signs of infection or rash on extremities or on axial skeleton.  Abdomen: Soft  nontender  Neuro: Cranial nerves II through XII are intact, neurovascularly intact in all extremities with 2+ DTRs and 2+ pulses.  Lymph: No lymphadenopathy of posterior or anterior cervical chain or axillae bilaterally.  Gait normal with good balance and coordination.  MSK:  Non tender with full range of motion and good stability and symmetric strength and tone of shoulders, elbows, wrist, hip, knee and ankles bilaterally.  Neck: Inspection mild loss of lordosis.Marland Kitchen No palpable stepoffs. Negative Spurling's maneuver. Mild limitation in all planes of movement. Grip strength and sensation normal in bilateral hands Strength good C4 to T1 distribution No  sensory change to C4 to T1 Negative Hoffman sign bilaterally Reflexes normal Tightness in the right trapezius.    Impression and Recommendations:     This case required medical decision making of moderate complexity.      Note: This dictation was prepared with Dragon dictation along with smaller phrase technology. Any transcriptional errors that result from this process are unintentional.

## 2018-02-16 ENCOUNTER — Ambulatory Visit: Payer: BLUE CROSS/BLUE SHIELD | Admitting: Family Medicine

## 2018-02-16 DIAGNOSIS — R519 Headache, unspecified: Secondary | ICD-10-CM

## 2018-02-16 DIAGNOSIS — R51 Headache: Secondary | ICD-10-CM | POA: Diagnosis not present

## 2018-02-16 DIAGNOSIS — G8929 Other chronic pain: Secondary | ICD-10-CM

## 2018-02-16 NOTE — Assessment & Plan Note (Signed)
Patient has noted some decrease in severity as well as frequency of the headaches.  Patient encouraged to continue the vitamin D.  We will continue to once weekly with patient needing significant more over the course the next several days.  We discussed that this will also need home exercises which was given today for posture and ergonomics.  Patient given note for some extended time for some of her work while she is in school.  Follow-up with me again in 6 weeks.

## 2018-02-16 NOTE — Patient Instructions (Signed)
Good to see you  I think you will do great  We will give you a little extension  Will refill the vitamin D when needed Exercises 3 times a week.  On wall with heels, butt shoulder and head touching for a goal of 5 minutes daily  See me again in 4 weeks if not perfect

## 2018-02-25 DIAGNOSIS — J309 Allergic rhinitis, unspecified: Secondary | ICD-10-CM | POA: Diagnosis not present

## 2018-02-25 DIAGNOSIS — G43909 Migraine, unspecified, not intractable, without status migrainosus: Secondary | ICD-10-CM | POA: Diagnosis not present

## 2018-02-25 DIAGNOSIS — M545 Low back pain: Secondary | ICD-10-CM | POA: Diagnosis not present

## 2018-02-25 DIAGNOSIS — E7800 Pure hypercholesterolemia, unspecified: Secondary | ICD-10-CM | POA: Diagnosis not present

## 2018-03-04 ENCOUNTER — Other Ambulatory Visit: Payer: Self-pay | Admitting: Family Medicine

## 2018-03-04 MED ORDER — VITAMIN D (ERGOCALCIFEROL) 1.25 MG (50000 UNIT) PO CAPS: 50000.0000 [IU] | ORAL_CAPSULE | ORAL | 0 refills | Status: AC

## 2018-03-04 NOTE — Telephone Encounter (Signed)
Refill done.  

## 2018-03-07 ENCOUNTER — Encounter: Payer: Self-pay | Admitting: Family Medicine

## 2018-03-15 ENCOUNTER — Ambulatory Visit: Payer: Self-pay | Admitting: Family Medicine

## 2018-03-30 DIAGNOSIS — E7800 Pure hypercholesterolemia, unspecified: Secondary | ICD-10-CM | POA: Diagnosis not present

## 2020-04-30 ENCOUNTER — Other Ambulatory Visit: Payer: Self-pay | Admitting: Family Medicine

## 2020-04-30 ENCOUNTER — Other Ambulatory Visit (HOSPITAL_COMMUNITY)
Admission: RE | Admit: 2020-04-30 | Discharge: 2020-04-30 | Disposition: A | Discharge status: Home / Self Care | Payer: BC Managed Care – PPO | Source: Ambulatory Visit | Attending: Family Medicine | Admitting: Family Medicine

## 2020-04-30 DIAGNOSIS — Z124 Encounter for screening for malignant neoplasm of cervix: Secondary | ICD-10-CM | POA: Diagnosis not present

## 2020-04-30 DIAGNOSIS — E7800 Pure hypercholesterolemia, unspecified: Secondary | ICD-10-CM | POA: Diagnosis not present

## 2020-04-30 DIAGNOSIS — Z0001 Encounter for general adult medical examination with abnormal findings: Secondary | ICD-10-CM | POA: Diagnosis not present

## 2020-05-03 LAB — CYTOLOGY - PAP
Adequacy: ABSENT
Comment: NEGATIVE
Diagnosis: UNDETERMINED — AB
High risk HPV: POSITIVE — AB

## 2020-06-21 DIAGNOSIS — Z03818 Encounter for observation for suspected exposure to other biological agents ruled out: Secondary | ICD-10-CM | POA: Diagnosis not present

## 2020-06-21 DIAGNOSIS — Z20822 Contact with and (suspected) exposure to covid-19: Secondary | ICD-10-CM | POA: Diagnosis not present

## 2020-07-14 DIAGNOSIS — Z20822 Contact with and (suspected) exposure to covid-19: Secondary | ICD-10-CM | POA: Diagnosis not present

## 2020-07-16 ENCOUNTER — Other Ambulatory Visit: Payer: BC Managed Care – PPO

## 2020-07-22 DIAGNOSIS — Z20822 Contact with and (suspected) exposure to covid-19: Secondary | ICD-10-CM | POA: Diagnosis not present

## 2020-12-10 ENCOUNTER — Encounter: Payer: Self-pay | Admitting: Neurology
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
## 2021-02-07 ENCOUNTER — Ambulatory Visit (INDEPENDENT_AMBULATORY_CARE_PROVIDER_SITE_OTHER): Payer: 59 | Admitting: Neurology

## 2021-02-07 ENCOUNTER — Encounter: Payer: Self-pay | Admitting: Neurology

## 2021-02-07 ENCOUNTER — Other Ambulatory Visit: Payer: Self-pay

## 2021-02-07 VITALS — BP 138/85 | HR 78 | Ht 60.0 in | Wt 141.8 lb

## 2021-02-07 DIAGNOSIS — R519 Headache, unspecified: Secondary | ICD-10-CM | POA: Diagnosis not present

## 2021-02-07 DIAGNOSIS — G43009 Migraine without aura, not intractable, without status migrainosus: Secondary | ICD-10-CM

## 2021-02-07 DIAGNOSIS — G8929 Other chronic pain: Secondary | ICD-10-CM | POA: Diagnosis not present

## 2021-02-07 MED ORDER — AIMOVIG 140 MG/ML ~~LOC~~ SOAJ: SUBCUTANEOUS | 0 refills | Status: DC

## 2021-02-07 MED ORDER — AIMOVIG 140 MG/ML ~~LOC~~ SOAJ: 140.0000 mg | SUBCUTANEOUS | 5 refills | Status: DC

## 2021-02-07 NOTE — Patient Instructions (Signed)
  1. Start Aimovig 140mg  every 28 days.  Continue topiramate 100mg  at bedtime. 2. Take rizatriptan 10mg  at earliest onset of headache.  May repeat dose once in 2 hours if needed.  Maximum 2 tablets in 24 hours. 3. Limit use of pain relievers to no more than 2 days out of the week.  These medications include acetaminophen, NSAIDs (ibuprofen/Advil/Motrin, naproxen/Aleve, triptans (Imitrex/sumatriptan), Excedrin, and narcotics.  This will help reduce risk of rebound headaches. 4. Be aware of common food triggers:  - Caffeine:  coffee, black tea, cola, Mt. Dew  - Chocolate  - Dairy:  aged cheeses (brie, blue, cheddar, gouda, [place], provolone, [place], Swiss, etc), chocolate milk, buttermilk, sour cream, limit eggs and yogurt  - Nuts, peanut butter  - Alcohol  - Cereals/grains:  FRESH breads (fresh bagels, sourdough, doughnuts), yeast productions  - Processed/canned/aged/cured meats (pre-packaged deli meats, hotdogs)  - MSG/glutamate:  soy sauce, flavor enhancer, pickled/preserved/marinated foods  - Sweeteners:  aspartame (Equal, Nutrasweet).  Sugar and Splenda are okay  - Vegetables:  legumes (lima beans, lentils, snow peas, fava beans, pinto peans, peas, garbanzo beans), sauerkraut, onions, olives, pickles  - Fruit:  avocados, bananas, citrus fruit (orange, lemon, grapefruit), mango  - Other:  Frozen meals, macaroni and cheese 5. Routine exercise 6. Stay adequately hydrated (aim for 64 oz water daily) 7. Keep headache diary 8. Maintain proper stress management 9. Maintain proper sleep hygiene 10. Do not skip meals 11. Consider supplements:  magnesium citrate 400mg  daily, riboflavin 400mg  daily, coenzyme Q10 100mg  three times daily. 12.  Follow up 4 to 6 months.

## 2021-02-10 ENCOUNTER — Encounter: Payer: Self-pay | Admitting: Neurology

## 2021-02-10 NOTE — Progress Notes (Signed)
Nichole Evans (Key: [id number]) Rx #: [id number] Aimovig 140MG /ML auto-injectors   Form [employer] Electronic Prior Authorization Form (2017 NCPDP) Created 3 days ago Sent to Plan 6 hours ago Plan Response 6 hours ago Submit Clinical Questions 6 hours ago Determination Favorable 1 hour ago Message from Plan Request Reference Number: [id number]. AIMOVIG INJ 140MG /ML is approved through 02/10/2022. Your patient may now fill this prescription and it will be covered.

## 2021-03-03 ENCOUNTER — Other Ambulatory Visit: Payer: Self-pay | Admitting: Physician Assistant

## 2021-03-03 DIAGNOSIS — R102 Pelvic and perineal pain: Secondary | ICD-10-CM

## 2021-03-03 DIAGNOSIS — R1031 Right lower quadrant pain: Secondary | ICD-10-CM

## 2021-03-05 ENCOUNTER — Telehealth: Payer: Self-pay | Admitting: Neurology

## 2021-03-05 NOTE — Telephone Encounter (Signed)
LMOVM for pt to come by the office to get a copay card.

## 2021-03-05 NOTE — Telephone Encounter (Signed)
Patient called in and left a message stating she was having issues getting her Aimovig filled and would like to see if she can get a copay card

## 2021-03-26 ENCOUNTER — Ambulatory Visit
Admission: RE | Admit: 2021-03-26 | Discharge: 2021-03-26 | Disposition: A | Discharge status: Home / Self Care | Payer: 59 | Source: Ambulatory Visit | Attending: Physician Assistant | Admitting: Physician Assistant

## 2021-03-26 ENCOUNTER — Other Ambulatory Visit: Payer: Self-pay

## 2021-03-26 DIAGNOSIS — R1031 Right lower quadrant pain: Secondary | ICD-10-CM

## 2021-03-26 DIAGNOSIS — R102 Pelvic and perineal pain: Secondary | ICD-10-CM

## 2021-03-26 IMAGING — CT CT ABD-PELV W/ CM
1 of 3 series · 13 of 32 positions shown, 18 images · IV contrast (iopamidol)
Comparison: None.

CLINICAL DATA: Lower abdominal pain

EXAM:
CT ABDOMEN AND PELVIS WITH CONTRAST
TECHNIQUE: Multidetector CT imaging of the abdomen and pelvis was performed
using the standard protocol following bolus administration of
intravenous contrast. Oral contrast also administered.
CONTRAST:  80mL [8F] IOPAMIDOL ([8F]) INJECTION 76%

[Series 2: abd/pelvis w/cm · axial · 0.70mm/px · z∈[-458,-68]mm · 13 of 92 slices shown, 18 images]
[im 7/92  soft-tissue]
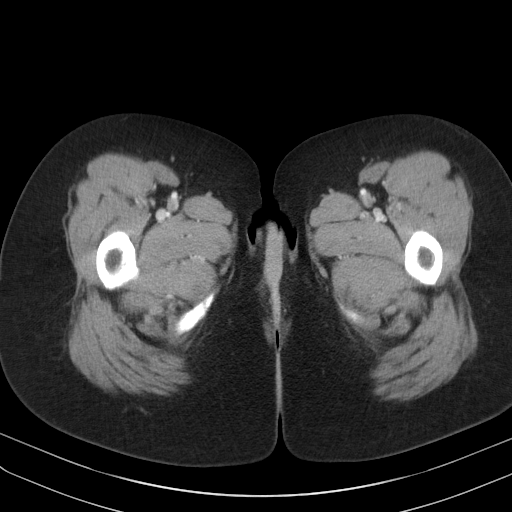
[im 7/92  bone]
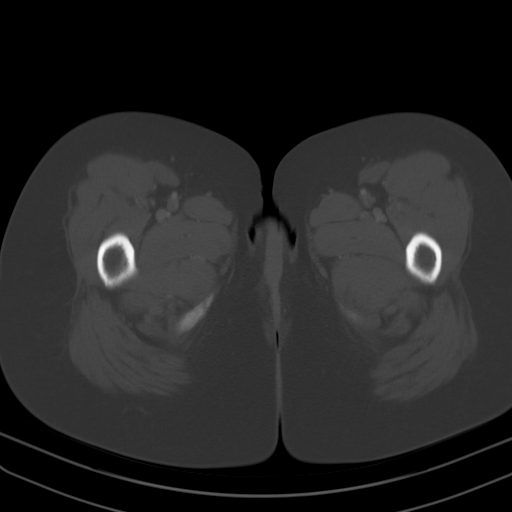
[im 13/92  soft-tissue]
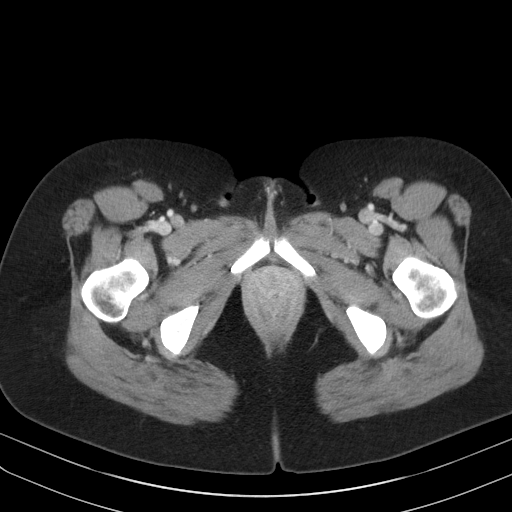
[im 19/92  soft-tissue]
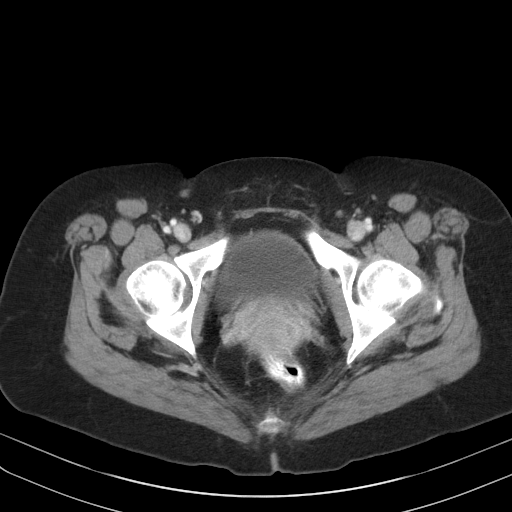
[im 31/92  soft-tissue]
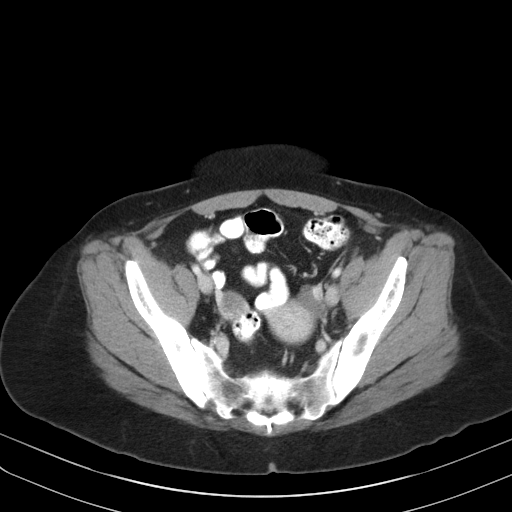
[im 37/92  soft-tissue]
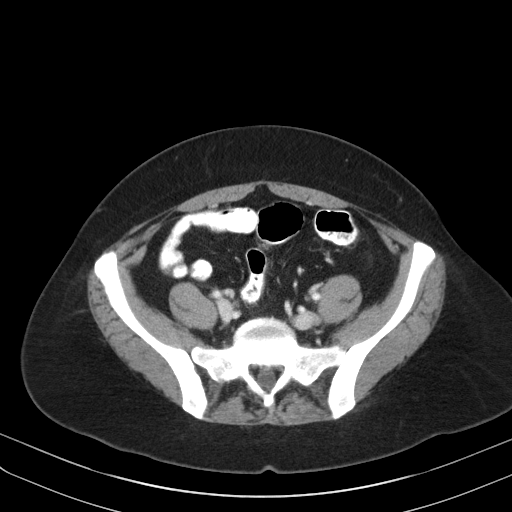
[im 43/92  soft-tissue]
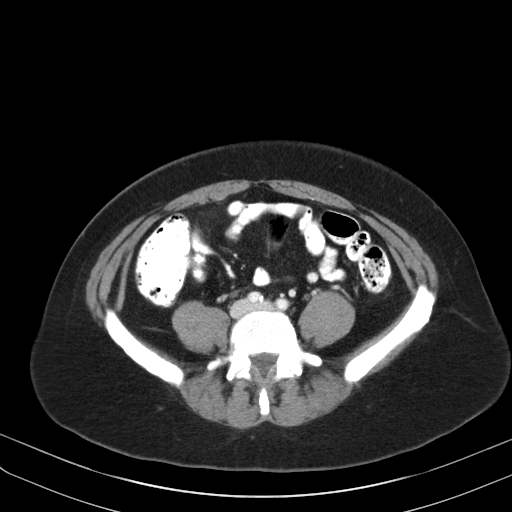
[im 49/92  soft-tissue]
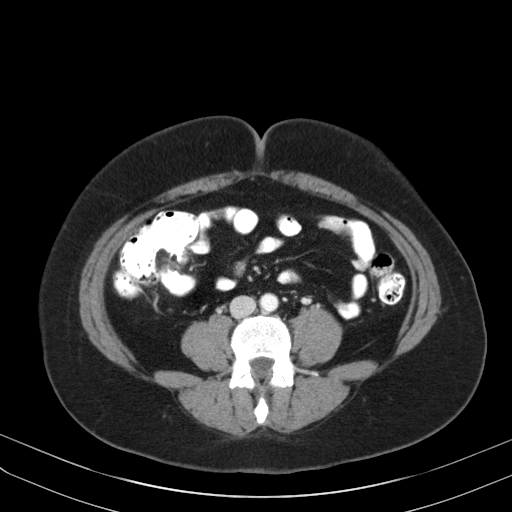
[im 55/92  soft-tissue]
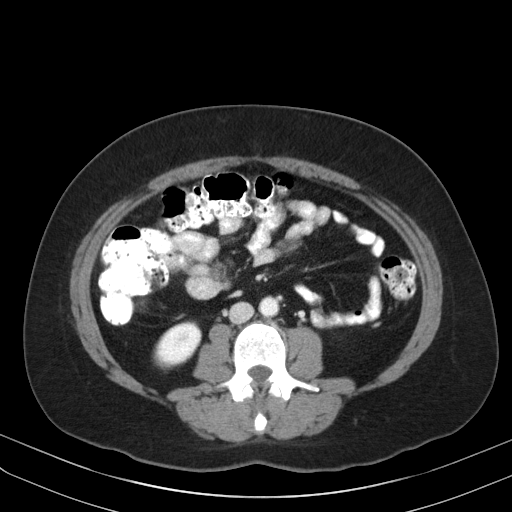
[im 61/92  soft-tissue]
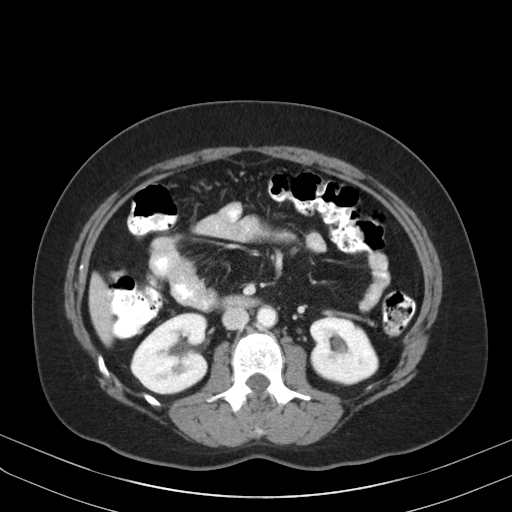
[im 61/92  bone]
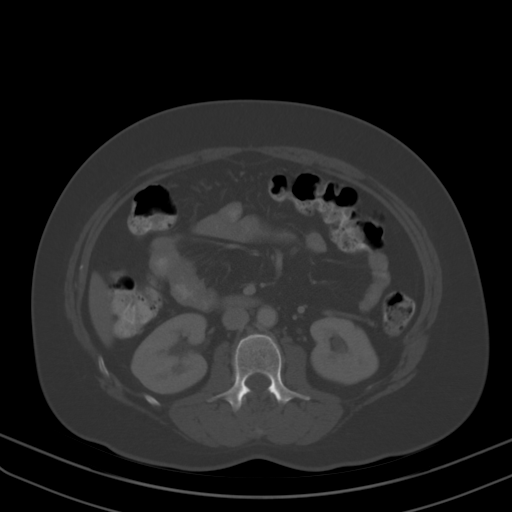
[im 67/92  lung]
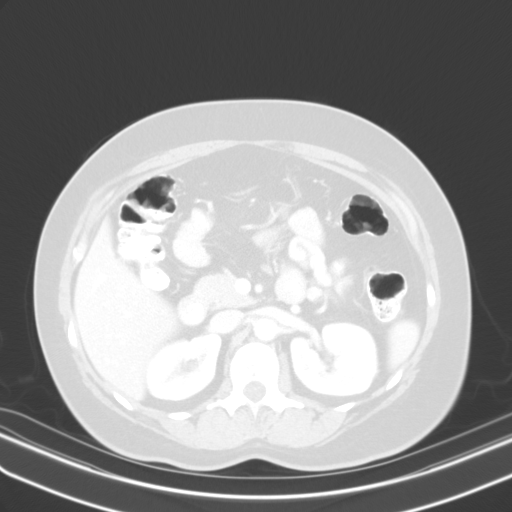
[im 73/92  soft-tissue]
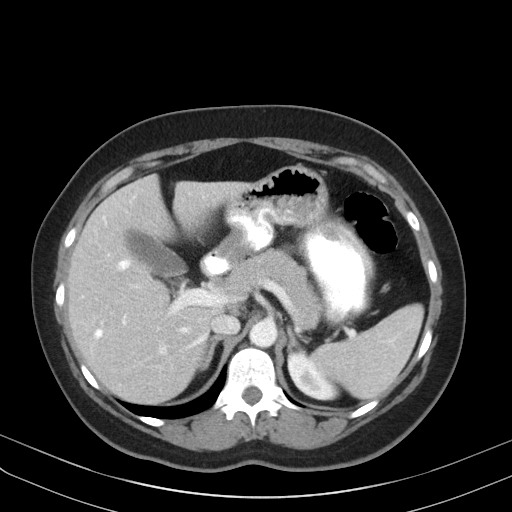
[im 73/92  lung]
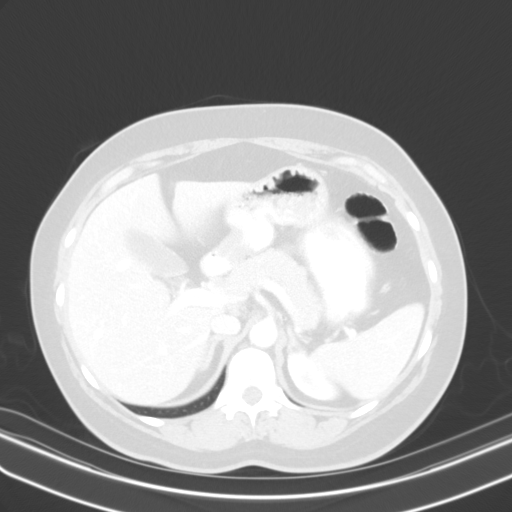
[im 79/92  soft-tissue]
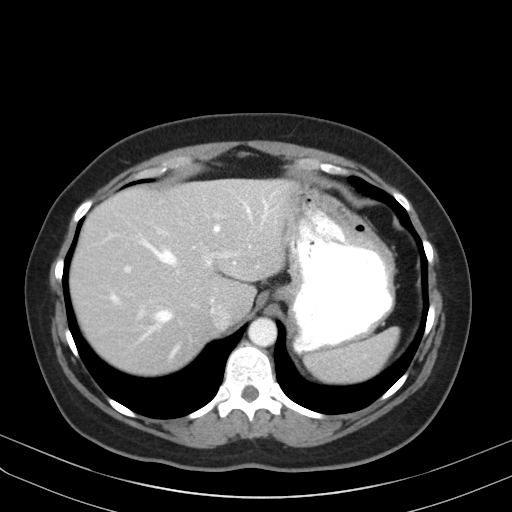
[im 79/92  lung]
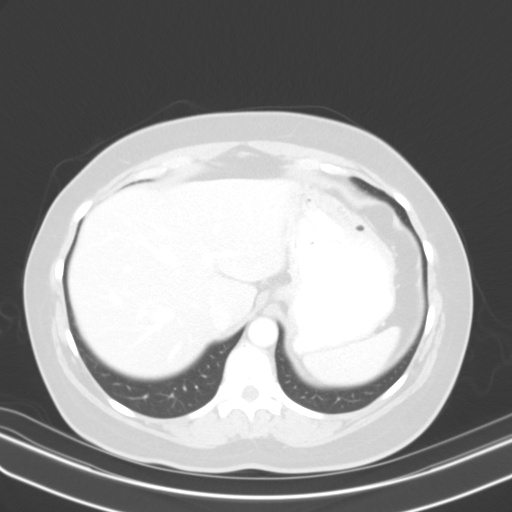
[im 85/92  soft-tissue]
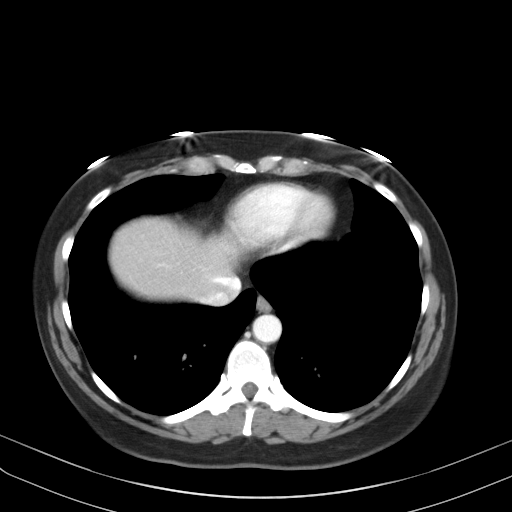
[im 85/92  lung]
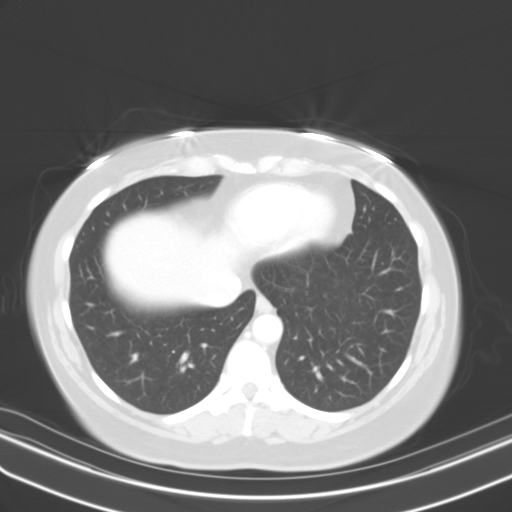

[13 of 32 positions shown; findings below may reference images not displayed]

FINDINGS: Lower chest: Lung bases are clear.

Hepatobiliary: There is a 3 mm probable cyst in the dome of the
liver on the right anteriorly. No other appreciable focal liver
lesions. Gallbladder wall is not appreciably thickened. There is no
biliary duct dilatation.

Pancreas: No pancreatic mass or inflammatory focus.

Spleen: There is a 5 mm probable hemangioma in the inferior spleen.
Spleen otherwise appears normal.

Adrenals/Urinary Tract: Adrenals bilaterally appear normal. No
appreciable renal mass or hydronephrosis on either side. There is no
evident renal or ureteral calculus on either side. Urinary bladder
is midline with wall thickness within normal limits.

Stomach/Bowel: There is localized narrowing in the gastric antrum
without ulceration evident. Stomach otherwise appears unremarkable.
No small or large bowel wall thickening. No evident bowel
obstruction. The terminal ileum appears normal. Appendix appears
normal. No evident free air or portal venous air.

Vascular/Lymphatic: There is no abdominal aortic aneurysm. There is
focal calcification in the right common iliac artery. No other
atherosclerotic change noted. Major venous structures appear patent.
No adenopathy evident in the abdomen or pelvis.

Reproductive: There is a complex predominantly cystic focus near the
junction of the uterus and cervix measuring 1.9 x 1.6 cm. Probable
nearby nabothian cysts in this area. There is a right adnexal mass
measuring 2.3 x 2.1 cm which has attenuation values slightly higher
than is expected with a simple cyst. No other adnexal masses.

Other: No evident abscess or ascites in the abdomen or pelvis.

Musculoskeletal: No blastic or lytic bone lesions evident. No
abdominal wall or intramuscular lesions.
IMPRESSION: 1. Suspected wall thickening in a portion of the gastric antrum
which may represent a degree of antral gastritis. Stomach otherwise
appears unremarkable. No ulceration evident by CT.

2. Probable hemorrhagic right ovarian cyst measuring 1.9 x 1.6 cm.
Complex cystic structure near the uterus-cervix junction, a
potential may both in cyst with hemorrhage or infection. Correlation
with pelvic ultrasound advised to further evaluate these areas.

3. No bowel obstruction. No abscess in the abdomen or pelvis.
Appendix appears normal.

4. No renal or ureteral calculi. No hydronephrosis. Urinary bladder
wall thickness normal.

## 2021-03-26 MED ORDER — IOPAMIDOL (ISOVUE-370) INJECTION 76%
80.0000 mL | Freq: Once | INTRAVENOUS | Status: AC | PRN
  Administered 2021-03-26: 80 mL via INTRAVENOUS
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
## 2021-08-18 ENCOUNTER — Ambulatory Visit: Payer: 59 | Admitting: Neurology

## 2021-09-18 ENCOUNTER — Telehealth: Payer: Self-pay | Admitting: Neurology

## 2021-09-18 NOTE — Telephone Encounter (Signed)
How frequent or the headaches (on average, how many days a week/month are they occurring)?   Every day How long do the headaches last?  All day Verify what preventative medication and dose you are taking (e.g. topiramate, propranolol, amitriptyline, Emgality, etc)  Aimovig not helping at all the topiramate the Migraine but no the Postraumatic headaches.  Verify which rescue medication you are taking (triptan, Advil, Excedrin, Aleve, Ubrelvy, etc)  Ubrlevy 50 mg given by her PCP and it not helping. Wanted to know if she could increase the dose.  How often are you taking pain relievers/analgesics/rescue mediction?  Not taking any NSID but she do take Tramadol two to three days a week.   Pt on Short term FMLA. Pt states her pain level sometimes could get up to a level 10 before she takes the Tramadol.   Pt wanted to know if she could see Dr.Jaffe sooner and have a MRI of the Brain ordered. She never had one done.

## 2021-09-18 NOTE — Telephone Encounter (Signed)
Pt called in and left a message. She is in "extreme head pain". She is wanting an MRI of her head.

## 2021-09-19 ENCOUNTER — Telehealth: Payer: Self-pay

## 2021-09-19 ENCOUNTER — Other Ambulatory Visit: Payer: Self-pay | Admitting: Neurology

## 2021-09-19 MED ORDER — AJOVY 225 MG/1.5ML ~~LOC~~ SOAJ: 225.0000 mg | SUBCUTANEOUS | 5 refills | Status: DC

## 2021-09-19 NOTE — Telephone Encounter (Signed)
Ajovy script sent to [facility] on [address]

## 2021-09-19 NOTE — Telephone Encounter (Signed)
Patient would like to try ajovy, please send in or let me know directions to send.

## 2021-09-19 NOTE — Telephone Encounter (Signed)
Tried calling pt , to advise her of Dr.Jaffe note.  1.  We can change from Aimovig to Ajovy, an alternative injection, also every 28 days (pending approval from her insurance).  2.  We can increase Ubrelvy to 100mg  - take 1 as needed, may repeat in 2 hours, maximum 2 tablets in 24 hours.  3.  It is premature to order MRI, especially since I have not seen her since March when I requested follow up in 4 to 6 months.  On that visit, she was already having daily headaches for several years.  She is encouraged to be put on the cancellation list - we have frequent cancellations
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
## 2021-09-25 ENCOUNTER — Ambulatory Visit (INDEPENDENT_AMBULATORY_CARE_PROVIDER_SITE_OTHER): Payer: 59 | Admitting: Neurology

## 2021-09-25 ENCOUNTER — Other Ambulatory Visit: Payer: Self-pay

## 2021-09-25 ENCOUNTER — Encounter: Payer: Self-pay | Admitting: Neurology

## 2021-09-25 ENCOUNTER — Telehealth: Payer: Self-pay

## 2021-09-25 VITALS — BP 132/86 | HR 77 | Ht 65.0 in | Wt 134.6 lb

## 2021-09-25 DIAGNOSIS — G43719 Chronic migraine without aura, intractable, without status migrainosus: Secondary | ICD-10-CM | POA: Diagnosis not present

## 2021-09-25 MED ORDER — UBRELVY 100 MG PO TABS: 1.0000 | ORAL_TABLET | ORAL | 5 refills | Status: DC | PRN

## 2021-09-25 MED ORDER — AJOVY 225 MG/1.5ML ~~LOC~~ SOAJ: 225.0000 mg | SUBCUTANEOUS | 5 refills | Status: DC

## 2021-09-25 NOTE — Patient Instructions (Signed)
Start Ajovy every 28 days Increase Ubrelvy to 100mg  Will get MRI of brain with and without contrast Follow up 6 months.

## 2021-09-25 NOTE — Telephone Encounter (Signed)
New message   Anastasio Champion KeyMadaline Brilliant - PA Case ID: [id number] Need help? Call us at [phone]  Outcome Additional Information Required  This medication or product was previously approved on [id number] from 2021-07-23 to 2022-07-23. **Please note: This request was submitted electronically. Formulary lowering, tiering exception, cost reduction and/or pre-benefit determination review (including prospective Medicare hospice reviews) requests cannot be requested using this method of submission. Providers contact us at [phone] for further assistance.  Drug Bernita Raisin 100MG  tablets Form [facility] Electronic Prior Authorization Form [phone] NCPDP)

## 2021-10-09 ENCOUNTER — Telehealth: Payer: Self-pay | Admitting: Neurology

## 2021-10-09 ENCOUNTER — Telehealth: Payer: Self-pay

## 2021-10-09 NOTE — Telephone Encounter (Signed)
Pateint called to check on the status of the PA for Ajovy and Ubrelvy.

## 2021-10-09 NOTE — Telephone Encounter (Signed)
Anastasio Champion Key: [id number] - PA Case ID: [id number] - Rx #: [id number] Need help? Call us at [phone] Status Sent to [facility] AJOVY (fremanezumab-vfrm) injection 225MG /1.5ML auto-injectors Form [facility] Electronic Prior Authorization Form (2017 NCPDP) Original Claim Info 70 Use Alt or PA REQ`D Call [phone]-Formulary DrugPackaging Exception Not CoveredNon-Formulary DrugAlt Opts: Aimovig, Emgality, Nurtec ODT

## 2021-10-09 NOTE — Telephone Encounter (Signed)
Anastasio Champion Key: Earlie Raveling - PA Case ID: [id number] Need help? Call us at [phone] Outcome Additional Information Required This medication or product was previously approved on [id number] from 2021-07-23 to 2022-07-23. **Please note: This request was submitted electronically. Formulary lowering, tiering exception, cost reduction and/or pre-benefit determination review (including prospective Medicare hospice reviews) requests cannot be requested using this method of submission. Providers contact us at [phone] for further assistance. Drug Bernita Raisin 100MG  tablets Form [facility] Electronic Prior Authorization Form [phone] NCPDP)

## 2021-10-09 NOTE — Telephone Encounter (Signed)
New message   Your information has been sent to [facility].  Anastasio Champion Key: [id number] - PA Case ID: [id number] - Rx #: [id number] Need help? Call us at [phone] Status Sent to [facility] AJOVY (fremanezumab-vfrm) injection 225MG /1.5ML auto-injectors Form [facility] Electronic Prior Authorization Form (2017 NCPDP) Original Claim Info 70 Use Alt or PA REQ`D Call [phone]-Formulary DrugPackaging Exception Not CoveredNon-Formulary DrugAlt Opts: Aimovig, Emgality, Nurtec ODT

## 2021-10-13 NOTE — Telephone Encounter (Signed)
F/u  Anastasio Champion Key: [id number] - PA Case ID: [id number] - Rx #: [id number] Need help? Call us at [phone]  Outcome Denied on November 11  Request Reference Number: [id number]. AJOVY INJ 225/1.5 is denied for not meeting the prior authorization requirement(s). Details of this decision are in the notice attached below or have been faxed to you.  Drug AJOVY (fremanezumab-vfrm) injection 225MG /1.5ML auto-injectors  Form [facility] Electronic Prior Authorization Form (2017 NCPDP)  Original Claim Info 70 Use Alt or PA REQ`D Call [phone]-Formulary DrugPackaging Exception Not CoveredNon-Formulary DrugAlt Opts: Aimovig, Emgality, Nurtec ODT

## 2021-10-15 ENCOUNTER — Telehealth: Payer: Self-pay

## 2021-10-15 ENCOUNTER — Ambulatory Visit
Admission: RE | Admit: 2021-10-15 | Discharge: 2021-10-15 | Disposition: A | Discharge status: Home / Self Care | Payer: 59 | Source: Ambulatory Visit | Attending: Neurology | Admitting: Neurology

## 2021-10-15 ENCOUNTER — Other Ambulatory Visit: Payer: Self-pay

## 2021-10-15 DIAGNOSIS — G43719 Chronic migraine without aura, intractable, without status migrainosus: Secondary | ICD-10-CM

## 2021-10-15 IMAGING — MR MR HEAD WO/W CM
12 series · 48 of 48 positions shown · IV contrast (multihance)
Comparison: None.

CLINICAL DATA: Headache, new or worsening, positional (Age 19-49y)

EXAM:
MRI HEAD WITHOUT AND WITH CONTRAST
TECHNIQUE: Multiplanar, multiecho pulse sequences of the brain and surrounding
structures were obtained without and with intravenous contrast.
CONTRAST:  12mL MULTIHANCE GADOBENATE DIMEGLUMINE 529 MG/ML IV SOLN

[Series 2: T1 · sagittal · 5.0mm · 0.45mm/px · 2 of 22 slices shown]
[im 1/22]
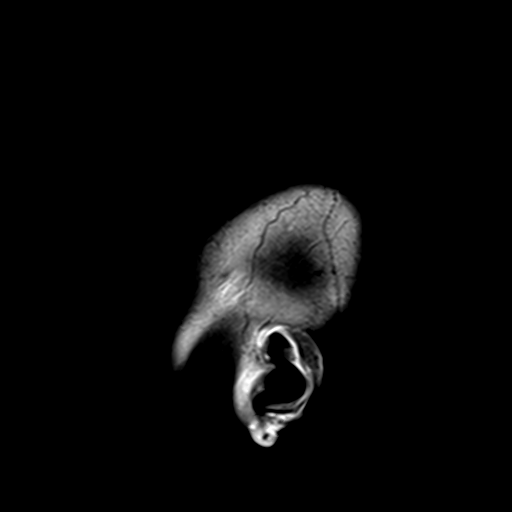
[im 22/22]
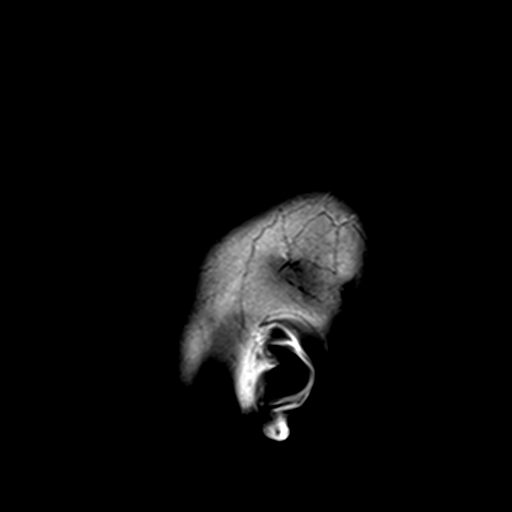

[Series 3: DWI · axial · 3.0mm · 1.80mm/px · z∈[-62,+76]mm · 7 of 94 slices shown (1 of 4)]
[im 1/94]
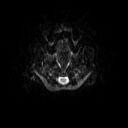
[im 16/94]
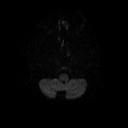
[im 32/94]
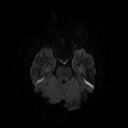
[im 47/94]
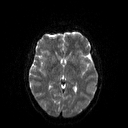
[im 63/94]
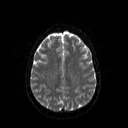
[im 78/94]
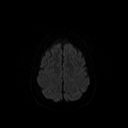
[im 94/94]
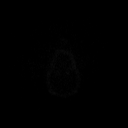

[Series 4: DWI · axial · 3.0mm · 1.80mm/px · z∈[-65,+76]mm · 3 of 46 slices shown (2 of 4)]
[im 1/46]
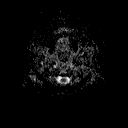
[im 23/46]
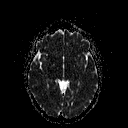
[im 46/46]
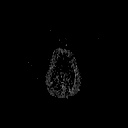

[Series 5: DWI · coronal · 5.0mm · 1.80mm/px · 5 of 67 slices shown (3 of 4)]
[im 1/67]
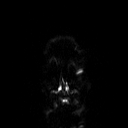
[im 17/67]
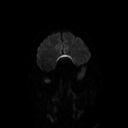
[im 34/67]
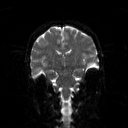
[im 50/67]
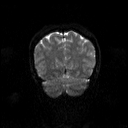
[im 67/67]
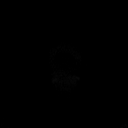

[Series 6: DWI · coronal · 5.0mm · 1.80mm/px · 2 of 34 slices shown (4 of 4)]
[im 1/34]
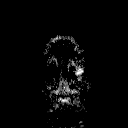
[im 34/34]
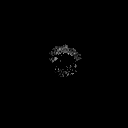

[Series 7: T2 · axial · 5.0mm · 0.60mm/px · 1 of 22 slices shown (1 of 2)]
[im 1/22]
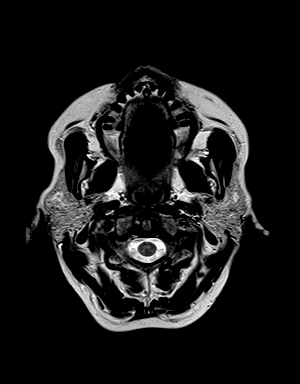

[Series 8: FLAIR · axial · 3.0mm · 0.45mm/px · z∈[-62,+73]mm · 2 of 30 slices shown]
[im 1/30]
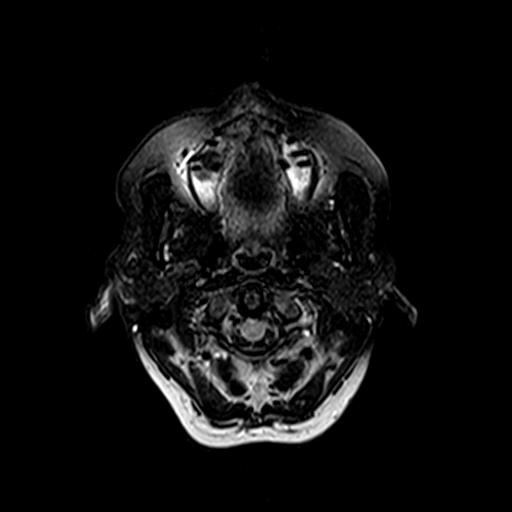
[im 30/30]
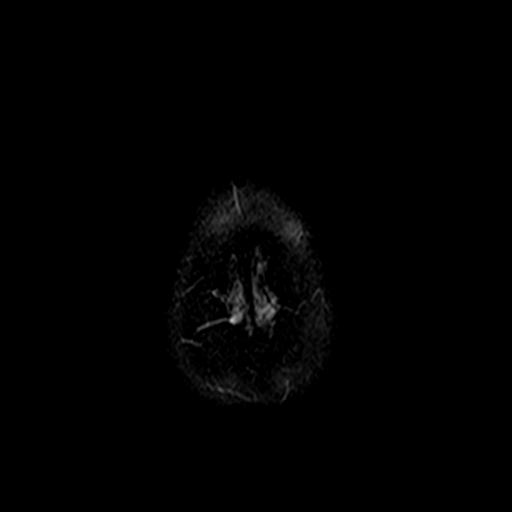

[Series 10: swi_images · axial · 4.0mm · 0.90mm/px · z∈[-64,+75]mm · 2 of 36 slices shown]
[im 1/36]
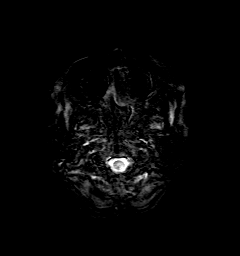
[im 36/36]
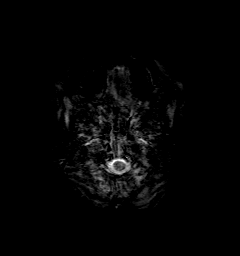

[Series 11: t1_mpr_tra · axial · 1.0mm · 0.75mm/px · z∈[-65,+78]mm · 10 of 144 slices shown]
[im 1/144]
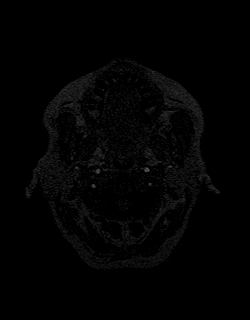
[im 16/144]
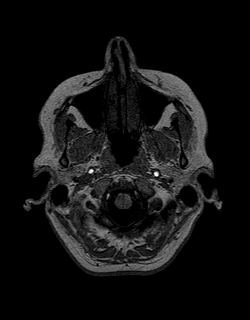
[im 32/144]
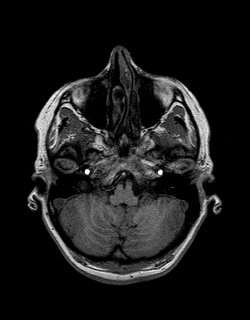
[im 48/144]
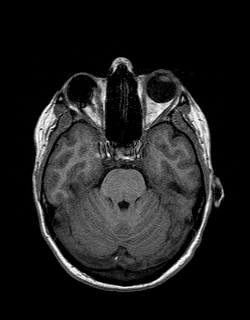
[im 64/144]
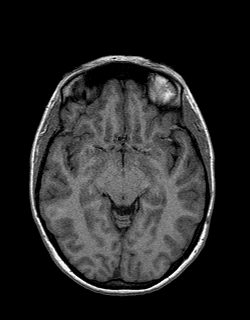
[im 80/144]
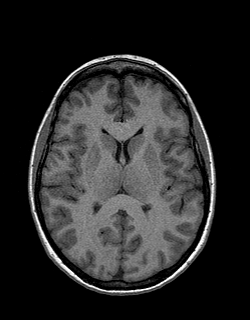
[im 96/144]
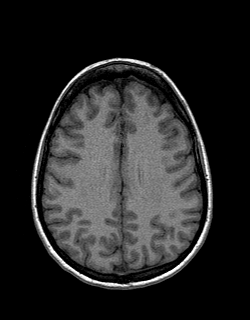
[im 112/144]
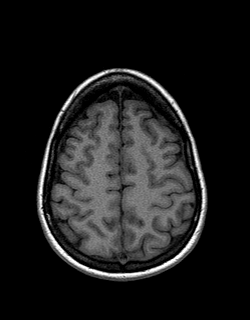
[im 128/144]
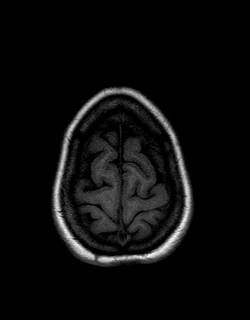
[im 144/144]
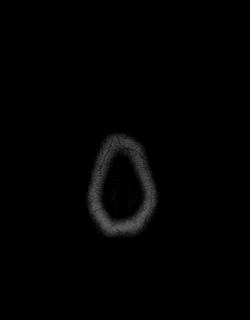

[Series 12: T2 · coronal · 5.0mm · 0.45mm/px · 2 of 34 slices shown (2 of 2)]
[im 1/34]
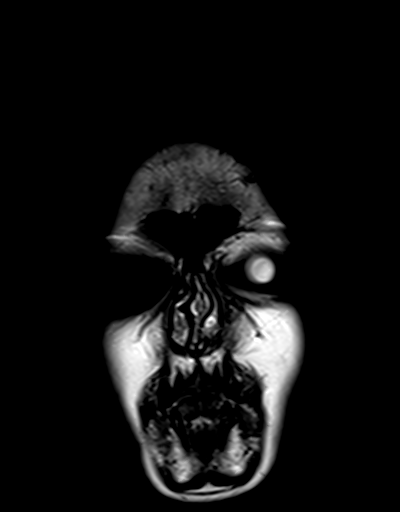
[im 34/34]
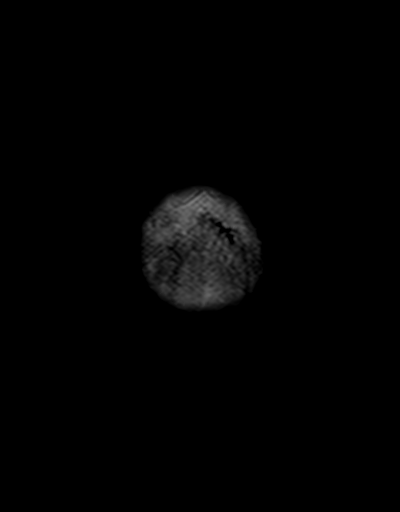

[Series 13: t1_mpr_tra post · axial · 1.0mm · 0.75mm/px · z∈[-65,+78]mm · 10 of 144 slices shown]
[im 1/144]
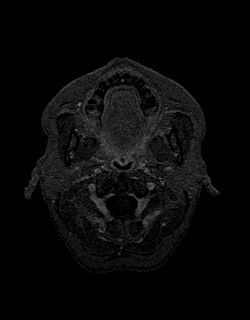
[im 16/144]
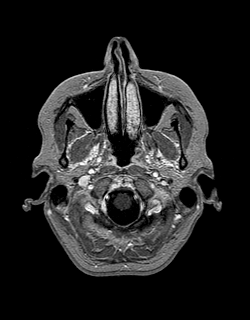
[im 32/144]
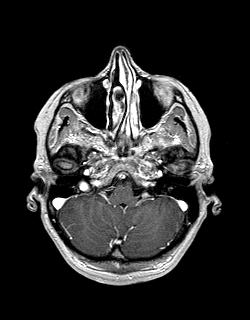
[im 48/144]
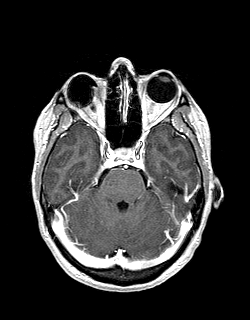
[im 64/144]
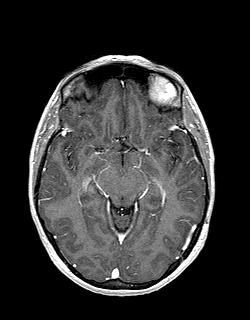
[im 80/144]
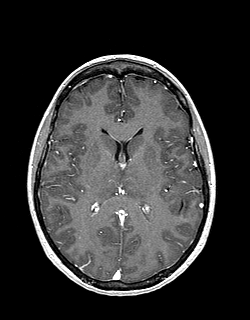
[im 96/144]
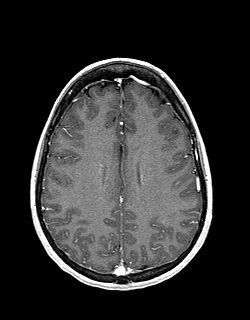
[im 112/144]
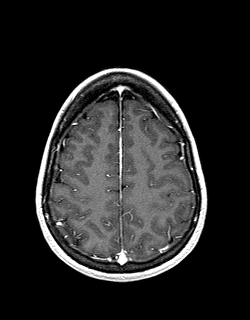
[im 128/144]
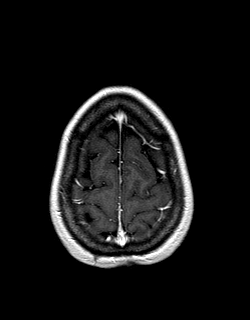
[im 144/144]
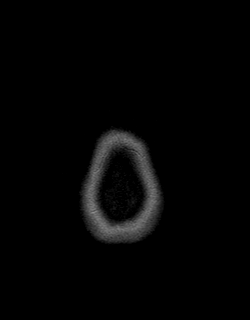

[Series 14: post cor · coronal · 5.0mm · 0.45mm/px · 2 of 34 slices shown]
[im 1/34]
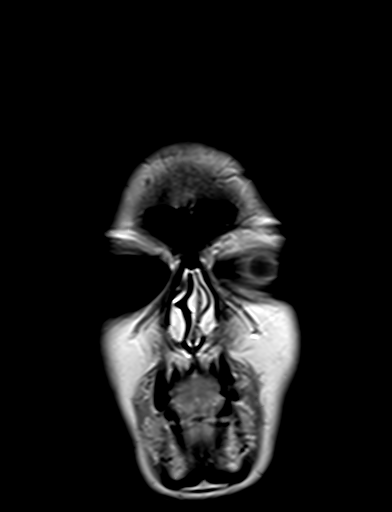
[im 34/34]
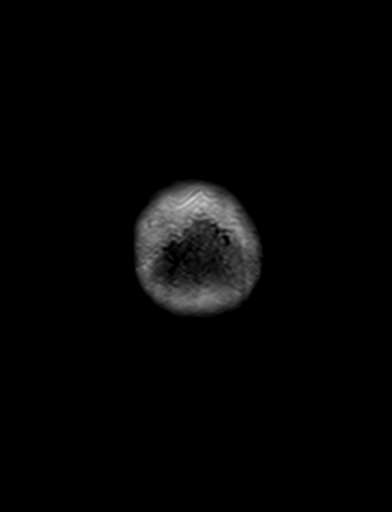

[48 of 48 positions shown; findings below may reference images not displayed]

FINDINGS: Brain: No acute infarction, hemorrhage, hydrocephalus, extra-axial
collection or mass lesion. No abnormal enhancement. Incidental small
benign right parietal developmental venous anomaly.

Vascular: Major arterial flow voids are maintained.

Skull and upper cervical spine: Normal marrow signal.

Sinuses/Orbits: Clear sinuses.  Right globe prosthesis.

Other: No mastoid
IMPRESSION: Normal brain MRI.  No acute abnormality.

## 2021-10-15 MED ORDER — GADOBENATE DIMEGLUMINE 529 MG/ML IV SOLN
12.0000 mL | Freq: Once | INTRAVENOUS | Status: AC | PRN
  Administered 2021-10-15: 12 mL via INTRAVENOUS

## 2021-10-15 NOTE — Telephone Encounter (Signed)
Pt called an informed that MRI is normal

## 2021-10-15 NOTE — Telephone Encounter (Signed)
-----   Message from [name], DO sent at 10/15/2021 11:54 AM EST ----- MRI of brain is normal

## 2021-10-21 ENCOUNTER — Emergency Department (HOSPITAL_COMMUNITY): Payer: 59

## 2021-10-21 ENCOUNTER — Other Ambulatory Visit: Payer: Self-pay

## 2021-10-21 ENCOUNTER — Encounter (HOSPITAL_COMMUNITY): Payer: Self-pay | Admitting: Emergency Medicine

## 2021-10-21 ENCOUNTER — Emergency Department (HOSPITAL_COMMUNITY)
Admission: EM | Admit: 2021-10-21 | Discharge: 2021-10-21 | Disposition: A | Discharge status: Home / Self Care | Payer: 59 | Attending: Emergency Medicine | Admitting: Emergency Medicine

## 2021-10-21 DIAGNOSIS — F17210 Nicotine dependence, cigarettes, uncomplicated: Secondary | ICD-10-CM | POA: Diagnosis not present

## 2021-10-21 DIAGNOSIS — K5900 Constipation, unspecified: Secondary | ICD-10-CM

## 2021-10-21 DIAGNOSIS — R1084 Generalized abdominal pain: Secondary | ICD-10-CM

## 2021-10-21 DIAGNOSIS — R109 Unspecified abdominal pain: Secondary | ICD-10-CM | POA: Diagnosis present

## 2021-10-21 LAB — URINALYSIS, ROUTINE W REFLEX MICROSCOPIC
Bilirubin Urine: NEGATIVE
Glucose, UA: NEGATIVE mg/dL
Hgb urine dipstick: NEGATIVE
Ketones, ur: NEGATIVE mg/dL
Leukocytes,Ua: NEGATIVE
Nitrite: NEGATIVE
Protein, ur: NEGATIVE mg/dL
Specific Gravity, Urine: 1.002 — ABNORMAL LOW (ref 1.005–1.030)
pH: 6 (ref 5.0–8.0)

## 2021-10-21 LAB — PREGNANCY, URINE: Preg Test, Ur: NEGATIVE

## 2021-10-21 IMAGING — CR DG ABDOMEN ACUTE W/ 1V CHEST
3 series · 3 of 3 positions shown · non-contrast
Comparison: No priors.

CLINICAL DATA: 44-year-old female with history of abdominal pain
and constipation for the past 3 days.

EXAM:
DG ABDOMEN ACUTE WITH 1 VIEW CHEST

[w chest pa]
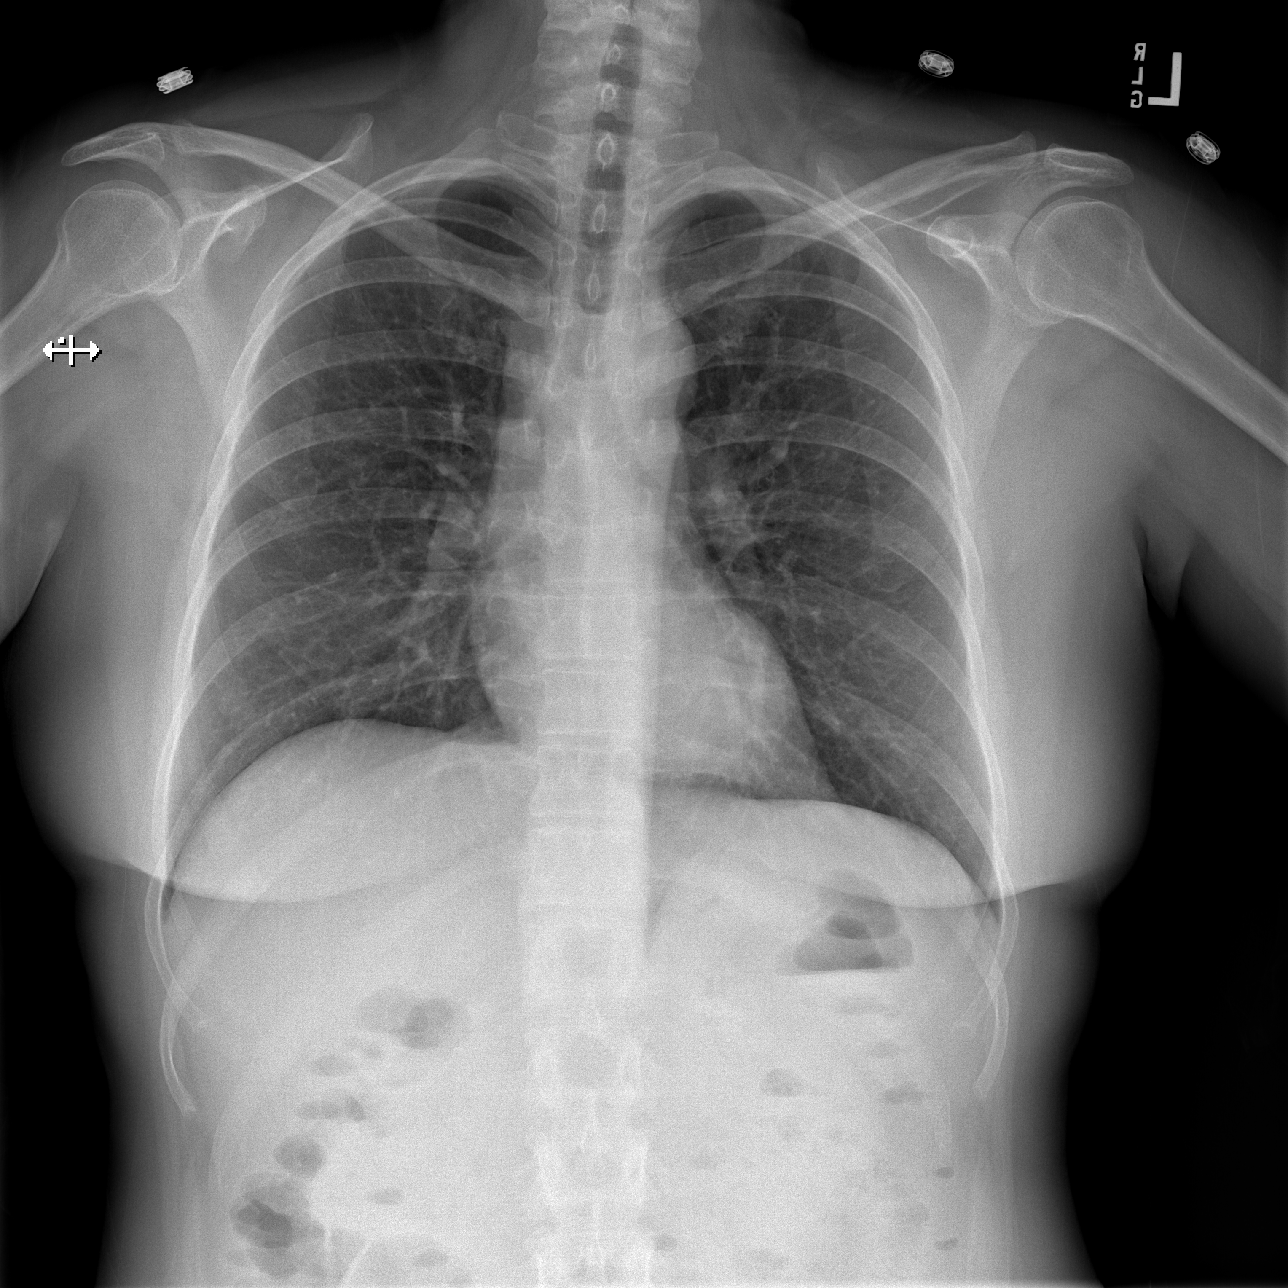

[w abdomen upright]
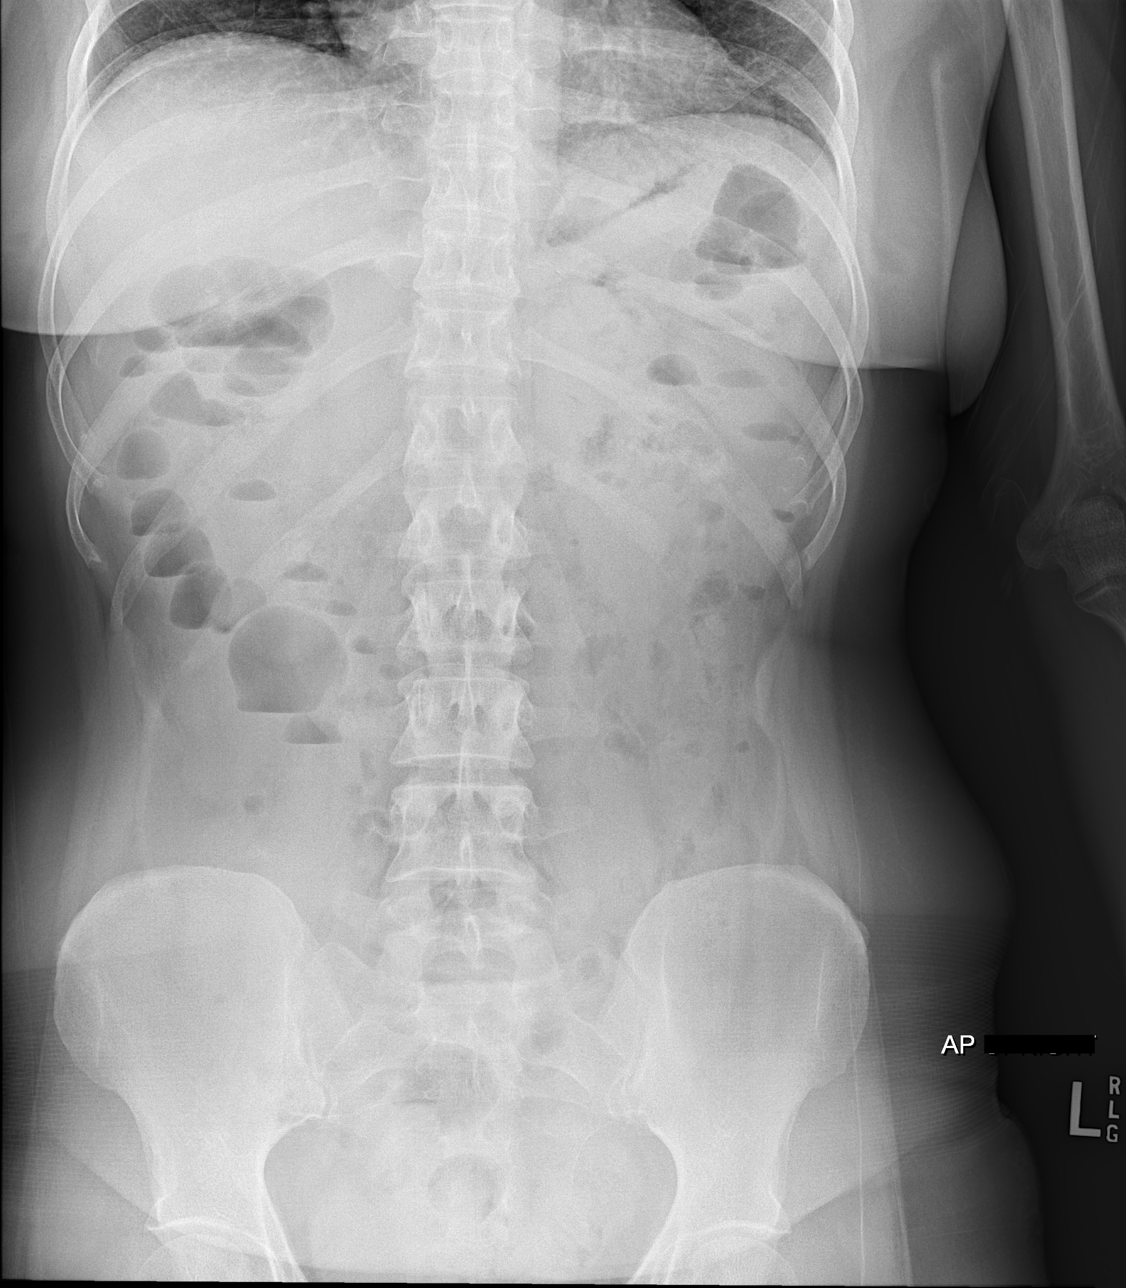

[t abdomen supine]
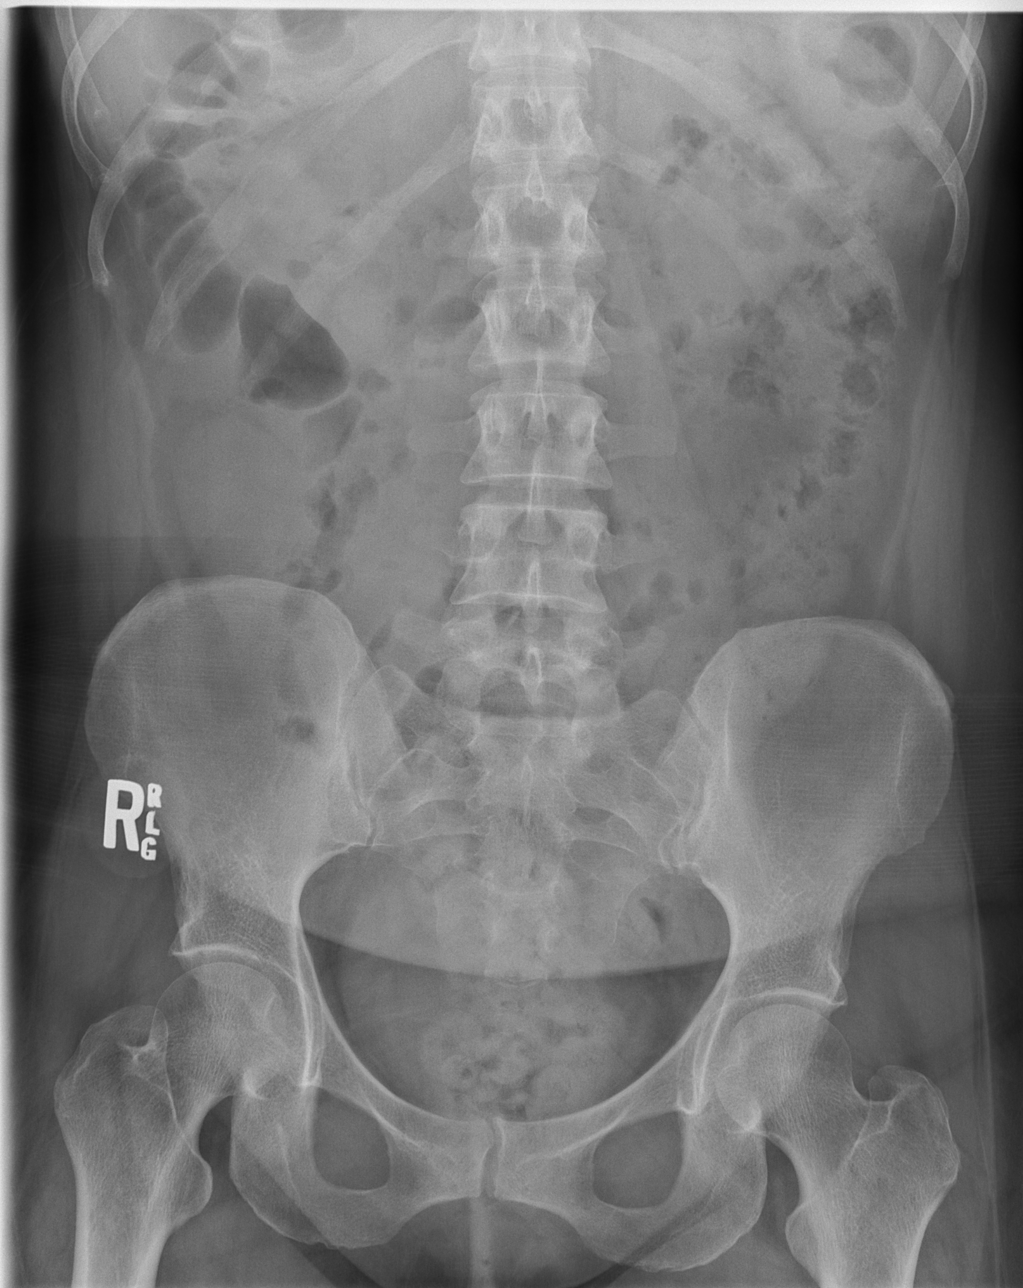

[3 of 3 positions shown; findings below may reference images not displayed]

FINDINGS: Lung volumes are normal. No consolidative airspace disease. No
pleural effusions. No pneumothorax. No pulmonary nodule or mass
noted. Pulmonary vasculature and the cardiomediastinal silhouette
are within normal limits.

Dedicated views of the abdomen demonstrate some air-fluid levels in
the colon. No pathologic dilatation of small bowel. No
pneumoperitoneum.
IMPRESSION: 1. Air-fluid levels present in the colon. This is nonspecific, but
could suggest colitis.
2. Nonobstructive bowel gas pattern.
3. No radiographic evidence of acute cardiopulmonary disease.

## 2021-10-21 MED ORDER — FLEET ENEMA 7-19 GM/118ML RE ENEM
1.0000 | ENEMA | Freq: Once | RECTAL | Status: AC
  Administered 2021-10-21: 1 via RECTAL
  Filled 2021-10-21: qty 1

## 2021-10-21 MED ORDER — DICYCLOMINE HCL 10 MG PO CAPS
10.0000 mg | ORAL_CAPSULE | Freq: Once | ORAL | Status: AC
  Administered 2021-10-21: 10 mg via ORAL
  Filled 2021-10-21: qty 1

## 2021-10-21 MED ORDER — MILK AND MOLASSES ENEMA
1.0000 | Freq: Once | RECTAL | Status: AC
  Administered 2021-10-21: 240 mL via RECTAL
  Filled 2021-10-21: qty 240

## 2021-10-21 MED ORDER — POLYETHYLENE GLYCOL 3350 17 G PO PACK
17.0000 g | PACK | Freq: Once | ORAL | Status: AC
  Administered 2021-10-21: 17 g via ORAL
  Filled 2021-10-21: qty 1

## 2021-10-21 NOTE — ED Provider Notes (Signed)
[facility]-EMERGENCY DEPT Provider Note   CSN: [id number] Arrival date & time: 10/21/21  0544     History Chief Complaint  Patient presents with   Constipation    Nichole Rudonya A Evans is a 45 y.o. female presents with complaints of acute constipation and abdominal pain onset around 4am.  Pt reports she woke from sleep with the urge to have a bowel movement.  Reports she has been straining for approximately 2 hours without the ability to produce a bowel movement.  She did take a dose of MiraLAX but this did not seem to help.  Since then she has developed some abdominal cramping but denies focal abdominal pain.  Patient reports she normally has a bowel movement every 3 to 4 days.  She is supposed to be taking daily MiraLAX per her gastroenterologist but has not been doing so recently.  Denies history of abdominal surgeries or bowel obstruction.  Reports she is passing gas.  Reports associated small amount of blood with straining.  She is not anticoagulated.  No other aggravating or alleviating factors.  The history is provided by the patient and medical records. No language interpreter was used.      Past Medical History:  Diagnosis Date   Chicken pox    Depression    Eating disorder    GERD (gastroesophageal reflux disease)    Glaucoma    Hay fever    Heart murmur    Migraines     Patient Active Problem List   Diagnosis Date Noted   Chronic headaches 02/01/2018    Past Surgical History:  Procedure Laterality Date   EYE SURGERY     rt eye removed   right eye removal  1982     OB History   No obstetric history on file.     No family history on file.  Social History   Tobacco Use   Smoking status: Every Day    Packs/day: 0.50    Types: Cigarettes   Smokeless tobacco: Never  Vaping Use   Vaping Use: Never used  Substance Use Topics   Alcohol use: Yes    Alcohol/week: 2.0 standard drinks    Types: 2 Glasses of wine per week   Drug use: No     Home Medications Prior to Admission medications   Medication Sig Start Date End Date Taking? Authorizing Provider  citalopram (CELEXA) 10 MG tablet Take 20 mg by mouth. 01/15/21   [provider]  cyclobenzaprine (FLEXERIL) 10 MG tablet Take 1 tablet (10 mg total) by mouth 2 (two) times daily as needed for muscle spasms. Patient not taking: Reported on 09/25/2021 05/25/15   Danelle Berryapia, Leisa, PA-C  Fremanezumab-vfrm (AJOVY) 225 MG/1.5ML SOAJ Inject 225 mg into the skin every 28 (twenty-eight) days. 09/25/21   Drema DallasJaffe, Adam R, DO  ibuprofen (ADVIL,MOTRIN) 800 MG tablet Take 1 tablet (800 mg total) by mouth 3 (three) times daily. 05/25/15   Danelle Berryapia, Leisa, PA-C  mometasone (NASONEX) 50 MCG/ACT nasal spray Place 2 sprays into the nose daily as needed (allergies).    [provider]  pantoprazole (PROTONIX) 40 MG tablet 1 tablet 03/05/20   [provider]  topiramate (TOPAMAX) 100 MG tablet Take 100 mg by mouth daily. 06/17/20   [provider]  traMADol (ULTRAM) 50 MG tablet Take 50 mg by mouth every 6 (six) hours as needed. 01/30/21   [provider]  traZODone (DESYREL) 50 MG tablet Take 50 mg by mouth at bedtime.  [provider]  Ubrogepant (UBRELVY) 100 MG TABS Take 1 tablet by mouth as needed (May repeat in 2 hours.  Maximum 2 tablets in 24 hours). 09/25/21   [name], DO  Vitamin D, Ergocalciferol, (DRISDOL) 50000 units CAPS capsule Take 1 capsule (50,000 Units total) by mouth every 7 (seven) days. 03/04/18   [name], DO    Allergies    Ceclor [cefaclor] and Cephalexin  Review of Systems   Review of Systems  Constitutional:  Negative for appetite change, diaphoresis, fatigue, fever and unexpected weight change.  HENT:  Negative for mouth sores.   Eyes:  Negative for visual disturbance.  Respiratory:  Negative for cough, chest tightness, shortness of breath and wheezing.   Cardiovascular:  Negative for chest pain.   Gastrointestinal:  Positive for abdominal pain and constipation. Negative for diarrhea, nausea and vomiting.  Endocrine: Negative for polydipsia, polyphagia and polyuria.  Genitourinary:  Negative for dysuria, frequency, hematuria and urgency.  Musculoskeletal:  Negative for back pain and neck stiffness.  Skin:  Negative for rash.  Allergic/Immunologic: Negative for immunocompromised state.  Neurological:  Negative for syncope, light-headedness and headaches.  Hematological:  Does not bruise/bleed easily.  Psychiatric/Behavioral:  Negative for sleep disturbance. The patient is not nervous/anxious.    Physical Exam Updated Vital Signs BP 132/83 (BP Location: Right Arm)   Pulse 68   Temp 98.8 F (37.1 C) (Oral)   Resp 18   Ht 5' (1.524 m)   Wt 59.9 kg   SpO2 99%   BMI 25.78 kg/m   Physical Exam Vitals and nursing note reviewed.  Constitutional:      General: She is not in acute distress.    Appearance: She is well-developed. She is not diaphoretic.     Comments: Awake, alert, nontoxic appearance, uncomfortable  HENT:     Head: Normocephalic and atraumatic.     Mouth/Throat:     Pharynx: No oropharyngeal exudate.  Eyes:     General: No scleral icterus.    Conjunctiva/sclera: Conjunctivae normal.  Cardiovascular:     Rate and Rhythm: Normal rate and regular rhythm.  Pulmonary:     Effort: Pulmonary effort is normal. No respiratory distress.     Breath sounds: No wheezing.  Abdominal:     General: Bowel sounds are normal.     Palpations: Abdomen is soft. There is no mass.     Tenderness: There is abdominal tenderness. There is no guarding or rebound.  Musculoskeletal:        General: Normal range of motion.     Cervical back: Normal range of motion and neck supple.  Skin:    General: Skin is warm and dry.  Neurological:     Mental Status: She is alert.     Comments: Speech is clear and goal oriented Moves extremities without ataxia  Psychiatric:        Mood and  Affect: Mood normal.    ED Results / Procedures / Treatments   Labs (all labs ordered are listed, but only abnormal results are displayed) Labs Reviewed  URINALYSIS, ROUTINE W REFLEX MICROSCOPIC  PREGNANCY, URINE    EKG None  Radiology No results found.  Procedures Procedures   Medications Ordered in ED Medications  milk and molasses enema (0 mLs Rectal Hold 10/21/21 0606)    ED Course  I have reviewed the triage vital signs and the nursing notes.  Pertinent labs & imaging results that were available during my care of the patient  were reviewed by me and considered in my medical decision making (see chart for details).    MDM Rules/Calculators/A&P                           Patient presents with constipation and mild abd pain.  Abd is soft and minimally tender, nonfocal.  Will obtain KUB to ensure no free air.  If reassuring, will give enema.  6:40 AM Work-up pending. At shift change care was transferred to [facility] who will follow pending studies, re-evaulate and determine disposition.      Final Clinical Impression(s) / ED Diagnoses Final diagnoses:  Generalized abdominal pain  Constipation, unspecified constipation type    Rx / DC Orders ED Discharge Orders     None        Ceciley Buist, Gwenlyn Perking 10/21/21 0640    Quintella Reichert, MD 10/21/21 [phone]

## 2021-10-21 NOTE — ED Notes (Signed)
Enema provided by RN and EMT-P.

## 2021-10-21 NOTE — Discharge Instructions (Signed)
You were seen today for evaluation of your constipation. Your workup was reassuring and I feel you are stable for discharge at this time following successful large bowel movement and relief of symptoms.   Your abdominal X-ray did reveal that you had a significant amount of stool in you abdomen. In the future, in order to avoid this happening again, I recommend daily Miralax use with 1 capfull a day. Additionally, it is very important that you consume foods high in fiber, I have attached more information about this to your discharge paperwork. You may also try Metamucil and Doculax if the Miralax is not sufficient. You can also purchase the Fleets enema that you were given in the ER today over the counter at your local pharmacy.  Return if development of emergent concerns such as inability to have bowel movements with also inability to pass gas, this would be concerning for a bowel obstruction. Additionally, you may return for development of any new or worsening symptoms.

## 2021-10-21 NOTE — ED Provider Notes (Signed)
Care assumed from [name], PA-C at shift change. Please see her noted for further details.   Briefly: Patient presents with 2 hours of constipation, tried MiraLAX at home without success.  Plan: KUB to RO obstruction and MOM enema then discharge if successful.  Patients KUB revealed that she has a significant amount of stool present without signs of obstruction. Originally, MOM enema was unsuccessful and resulted in patient having increasing pain and discomfort. Gave Bentyl for her symptoms, Miralax, and Fleets enema which resulted in patient having a very large bowel movement and immediate relief of her pain. Patient is asymptomatic at this time, therefore further workup is not indicated. UA without signs of infection and she is not pregnant. Patient is stable for discharge at this time. Educated on proper at home constipation management and red flag symptoms that would prompt immediate return. Patient discharged in stable condition.   [name] 10/21/21 0600    [name], MD 10/21/21 [phone]

## 2021-10-21 NOTE — ED Triage Notes (Signed)
Patient arrives with constipation x3 days. Patient takes tramadol and states when she uses it she gets constipated. Patient states she took 2 capfuls of mirilax around 0400 and states it has yet to work. Patient complaining of abdominal cramping.    164/80 72 18 99 112 97.9

## 2021-10-22 ENCOUNTER — Other Ambulatory Visit: Payer: Self-pay | Admitting: Neurology

## 2021-10-22 ENCOUNTER — Telehealth: Payer: Self-pay | Admitting: Neurology

## 2021-10-22 MED ORDER — EMGALITY 120 MG/ML ~~LOC~~ SOAJ: 120.0000 mg | SUBCUTANEOUS | 5 refills | Status: DC

## 2021-10-22 MED ORDER — EMGALITY 120 MG/ML ~~LOC~~ SOAJ: 240.0000 mg | Freq: Once | SUBCUTANEOUS | 0 refills | Status: AC

## 2021-10-22 NOTE — Telephone Encounter (Signed)
Pt called informed that Emgality was called in first dose requires 2 injections then just 1 injection every 28 days thereafter. Pt verbalized understanding

## 2021-10-22 NOTE — Telephone Encounter (Signed)
Last ov note: Started Aimovig in March.  The first 2 weeks, she had no pain but then has since had daily headaches.    Please advised.   Pt to stop by and pick up Urbelvy.

## 2021-10-22 NOTE — Telephone Encounter (Signed)
Pt states that her insurance decline the ajovy and [name] said there was another injection and she wanted to speak to someone about that and getting that injection   She was not sure of the name of the medication    She also had questions about a prior auth for the  [place] sent her to [place] [facility] about that

## 2021-10-29 ENCOUNTER — Telehealth: Payer: Self-pay | Admitting: Neurology

## 2021-10-29 MED ORDER — EMGALITY 120 MG/ML ~~LOC~~ SOAJ: 120.0000 mg | SUBCUTANEOUS | 5 refills | Status: AC

## 2021-10-29 NOTE — Telephone Encounter (Signed)
[facility] - [place], [place] - [address]  [address], [place]   Is the wrong pharmacy, this should have been taken off she said.  Need it sent to [place] on [address]

## 2021-10-29 NOTE — Telephone Encounter (Signed)
Pt dont think emgality has not been sent in yet to pharmacy. Called pharmacy to discuss Bernita Raisin - needs a PA. Toy Cookey may need to do an override to 100mg . 50mg  has been app till September, but needs the 100mg .

## 2021-10-29 NOTE — Telephone Encounter (Signed)
LMOVM to call back with Pharmacy.

## 2021-11-11 ENCOUNTER — Telehealth: Payer: Self-pay

## 2021-11-11 NOTE — Telephone Encounter (Signed)
Please give the office a call for disability forms.   Need payment if she wants dr.jaffe to fill them out.   Also needs to speak to CMA.

## 2021-11-18 ENCOUNTER — Telehealth: Payer: Self-pay

## 2021-11-18 NOTE — Telephone Encounter (Signed)
New message   Your information has been sent to [facility].  Anastasio Champion ([employer] Ing) Rx #: [id number] Emgality 120MG /ML auto-injectors (migraine)   Form [facility] Electronic Prior Authorization Form (2017 NCPDP) Created 20 days ago Sent to Plan 6 minutes ago Plan Response 5 minutes ago Submit Clinical Questions less than a minute ago Determination Wait for Determination Please wait for [facility] 2017 NCPDP to return a determination.

## 2021-11-19 ENCOUNTER — Telehealth: Payer: Self-pay | Admitting: Neurology

## 2021-11-19 MED ORDER — UBRELVY 100 MG PO TABS: 1.0000 | ORAL_TABLET | ORAL | 5 refills | Status: DC | PRN

## 2021-11-19 NOTE — Telephone Encounter (Signed)
Advised she has samples of Urblvy waiting on her at the office as well as the chart states she has an approval for them to pick up at the pharmacy. As for [facility] it was approved as well and a script was sent in November for her to pick up.   I will call the pharmacy to see why they will not allow her pick up Urblevy. Per rep their system denies the Nichole Evans even though our approval is good til 06/2022.   I will advised Gesila.   Spoke to rep at [facility] approval for 8 tabs for 30 days.

## 2021-11-19 NOTE — Telephone Encounter (Signed)
Patient states she is in a lot of pain and wants to speak with a doctor. Headache. Cant get injection due to ins

## 2021-11-20 NOTE — Telephone Encounter (Signed)
Anastasio Champion Key: Vivien Rota - PA Case ID: [id number] Need help? Call us at [phone] Outcome Additional Information Required This medication or product was previously approved on [id number] from 2021-07-23 to 2022-07-23. **Please note: This request was submitted electronically. Formulary lowering, tiering exception, cost reduction and/or pre-benefit determination review (including prospective Medicare hospice reviews) requests cannot be requested using this method of submission. Providers contact us at [phone] for further assistance. Drug Bernita Raisin 100MG  tablets Form [facility] Electronic Prior Authorization Form [phone] NCPDP)

## 2021-11-20 NOTE — Telephone Encounter (Signed)
Nichole Evans ([employer]) Rx #: [id number] Emgality 120MG /ML auto-injectors (migraine)   Form [facility] Electronic Prior Authorization Form (2017 NCPDP) Created 22 days ago Sent to Plan 2 days ago Plan Response 2 days ago Submit Clinical Questions 2 days ago Determination Favorable 2 days ago Message from Plan Request Reference Number: [id number]. EMGALITY INJ 120MG /ML is approved through 05/19/2022. Your patient may now fill this prescription and it will be covered.

## 2021-11-20 NOTE — Telephone Encounter (Signed)
Nichole Evans (Nichole Evans) Rx #: [id number] Emgality 120MG /ML auto-injectors (migraine)   Form [facility] Electronic Prior Authorization Form (2017 NCPDP) Created 22 days ago Sent to Plan 2 days ago Plan Response 2 days ago Submit Clinical Questions 2 days ago Determination Favorable 2 days ago Message from Plan Request Reference Number: [id number]. EMGALITY INJ 120MG /ML is approved through 05/19/2022. Your patient may now fill this prescription and it will be covered.

## 2021-11-25 ENCOUNTER — Telehealth: Payer: Self-pay | Admitting: Neurology

## 2021-11-25 NOTE — Telephone Encounter (Signed)
Pt advised, Then she shouldn't take the injection.  I would recommend Botox injections which I perform in office every 3 months.  It is indicated for chronic migraine.  For documentation purposes, she has at least 15 headache days a month for over 3 consecutive months and has failed topiramate, venlafaxine, beta blocker, Aimovig and Emgality

## 2021-11-25 NOTE — Telephone Encounter (Signed)
Pt called in stating she got her Emgality from the pharmacy, but it was only 1 shot. She stated Dr. [name] told her to start it off with 2 injections. She also has a bruise after the injection that is now black and turning purple. Her headaches are a 7-9 out of 10 since last Monday.

## 2021-11-25 NOTE — Telephone Encounter (Signed)
Per pt the site of injections has a bump and itching sine 11/21/21. Now it is purple/Black color.   Please advise Pt never had a chance to take the second injection due to her pharmacy not giving her the correct script. Loading dose script sent 10/22/21.

## 2021-11-28 ENCOUNTER — Telehealth: Payer: Self-pay

## 2021-11-28 NOTE — Telephone Encounter (Signed)
Anastasio Champion KeyHervey Ard - PA Case ID: [id number] Need help? Call us at [phone] Status Sent to [facility] Botox 200UNIT solution Form [facility] Electronic Prior Authorization Form (2017 NCPDP)

## 2021-11-28 NOTE — Telephone Encounter (Signed)
New message   Your information has been sent to [facility].  Anastasio Champion KeyHervey Ard - PA Case ID: [id number] Need help? Call us at [phone] Status Sent to [facility] Botox 200UNIT solution Form [facility] Electronic Prior Authorization Form (2017 NCPDP)

## 2021-12-19 ENCOUNTER — Telehealth: Payer: Self-pay | Admitting: Neurology

## 2021-12-19 NOTE — Telephone Encounter (Signed)
F/u   11/28/2021 Attached notice of denial from [place]  Dear Cathrine Muster, On behalf of [place], [facility] is responsible for reviewing pharmacy services provided to [facility] members. We received a request from your prescriber for coverage of Botox Inj 200unit. We reviewed all of the information you and/or your doctor sent to [place] and sent the information to an appropriate physician specialist if needed. Unfortunately, we must deny coverage for Botox.  Why was my request denied? This request was denied because you did not meet the following clinical requirements: The requested medication and/or diagnosis are not a covered benefit and excluded from coverage in accordance with the terms and conditions of your plan benefit. Therefore, the request has been administratively denied. The request to perform a coverage review is denied because a prior authorization cannot be performed for products excluded under the pharmacy benefit but to determine if this drug may be covered under the medical benefit you can contact the [place] by calling [phone]. For additional information regarding plan coverage, the member can contact Member Services by calling the number on the back of their ID card.  The reason(s) [facility] did not approve this medication can be found above. This denial is based on our Botox drug coverage policy, in addition to any supplementary information you or your prescriber may have submitted.

## 2021-12-19 NOTE — Telephone Encounter (Signed)
Pt called in wanting an update on her prior authorization denial for her Botox. She also wanted to make sure Dr. [name] is aware she has had an increase in headaches 12/03/21-12/12/21. She had to call out 4 days from work. The last few days have been fine. She would like to see if it's ok to pick up her Emgality prescription? She is ok with the bruise at this point.

## 2021-12-20 ENCOUNTER — Other Ambulatory Visit: Payer: Self-pay

## 2021-12-20 ENCOUNTER — Emergency Department (HOSPITAL_COMMUNITY)
Admission: EM | Admit: 2021-12-20 | Discharge: 2021-12-20 | Disposition: A | Discharge status: Home / Self Care | Payer: 59 | Attending: Emergency Medicine | Admitting: Emergency Medicine

## 2021-12-20 ENCOUNTER — Encounter (HOSPITAL_COMMUNITY): Payer: Self-pay

## 2021-12-20 DIAGNOSIS — R52 Pain, unspecified: Secondary | ICD-10-CM | POA: Diagnosis present

## 2021-12-20 DIAGNOSIS — G894 Chronic pain syndrome: Secondary | ICD-10-CM | POA: Insufficient documentation

## 2021-12-20 DIAGNOSIS — R45851 Suicidal ideations: Secondary | ICD-10-CM | POA: Insufficient documentation

## 2021-12-20 DIAGNOSIS — G8929 Other chronic pain: Secondary | ICD-10-CM

## 2021-12-20 NOTE — ED Provider Notes (Signed)
[facility]-EMERGENCY DEPT Provider Note   CSN: [id number] Arrival date & time: 12/20/21  0014     History  Chief Complaint  Patient presents with   Suicide Attempt    Took Tramadol x2, + ETOH, and looking for a hand gun.    Nichole Evans is a 46 y.o. female.  HPI     Patient comes in with chief complaint of suicidal ideation. Patient has history of depression, chronic pain syndrome.  She indicates that over the last few days, she has been more pain than usual, and additionally it has been very frustrating for her to deal with the insurance company.  Allegedly her Botox was declined.  She had called neurology team to help her, but allegedly nobody called her back.  Every Friday she will drink tequila, and tonight the alcohol got the best of her.  She started having suicidal thoughts, called the suicide hotline and end of being here.  She has no desire to hurt herself.  She wants to feel better.  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  She just got carried away.  According to the police, they brought patient here.  Patient was trying to locate a gun and wanting to shoot herself.  Patient is requesting that we do not IVC her and she wants to go home now.   We spoke with patient's significant other Nichole RuizJohn, who was willing to pick her up as the responsible adult.  Evans informed me that he has put the gun away.  He also informs me that they will be having conversation about seeking help with psychiatrist.  Patient informs me that she already knows of behavioral health urgent care, she has not seen her psychiatrist recently.  She is taking all of her medications as prescribed.   Home Medications Prior to Admission medications   Medication Sig Start Date End Date Taking? Authorizing Provider  citalopram (CELEXA) 10 MG tablet Take 20 mg by mouth. 01/15/21   [provider]  cyclobenzaprine (FLEXERIL) 10 MG tablet Take 1 tablet (10 mg total) by mouth 2 (two) times daily as  needed for muscle spasms. Patient not taking: Reported on 09/25/2021 05/25/15   Danelle Berryapia, Leisa, PA-C  Fremanezumab-vfrm (AJOVY) 225 MG/1.5ML SOAJ Inject 225 mg into the skin every 28 (twenty-eight) days. 09/25/21   Everlena CooperJaffe, Adam R, DO  Galcanezumab-gnlm (EMGALITY) 120 MG/ML SOAJ Inject 120 mg into the skin every 28 (twenty-eight) days. 10/29/21   Drema DallasJaffe, Adam R, DO  ibuprofen (ADVIL,MOTRIN) 800 MG tablet Take 1 tablet (800 mg total) by mouth 3 (three) times daily. 05/25/15   Danelle Berryapia, Leisa, PA-C  mometasone (NASONEX) 50 MCG/ACT nasal spray Place 2 sprays into the nose daily as needed (allergies).    [provider]  pantoprazole (PROTONIX) 40 MG tablet 1 tablet 03/05/20   [provider]  topiramate (TOPAMAX) 100 MG tablet Take 100 mg by mouth daily. 06/17/20   [provider]  traMADol (ULTRAM) 50 MG tablet Take 50 mg by mouth every 6 (six) hours as needed. 01/30/21   [provider]  traZODone (DESYREL) 50 MG tablet Take 50 mg by mouth at bedtime.    [provider]  Ubrogepant (UBRELVY) 100 MG TABS Take 1 tablet by mouth as needed for up to 30 doses (May repeat in 2 hours.  Maximum 2 tablets in 24 hours). 11/19/21   Drema DallasJaffe, Adam R, DO  Vitamin D, Ergocalciferol, (DRISDOL) 50000 units CAPS capsule Take 1 capsule (50,000 Units  total) by mouth every 7 (seven) days. 03/04/18   [name], DO      Allergies    Ceclor [cefaclor] and Cephalexin    Review of Systems   Review of Systems  Physical Exam Updated Vital Signs BP 134/84 (BP Location: Left Arm)    Pulse 86    Resp 16    Ht 5' (1.524 m)    Wt 59.9 kg    LMP 09/13/2021 (Approximate)    SpO2 100%    BMI 25.78 kg/m  Physical Exam Vitals and nursing note reviewed.  Constitutional:      Appearance: She is well-developed.  HENT:     Head: Atraumatic.  Cardiovascular:     Rate and Rhythm: Normal rate.  Pulmonary:     Effort: Pulmonary effort is normal.  Musculoskeletal:     Cervical back: Normal range  of motion and neck supple.  Skin:    General: Skin is warm and dry.  Neurological:     Mental Status: She is alert and oriented to person, place, and time.  Psychiatric:     Comments: Tearful, emotionally labile, remorseful for being in the ER    ED Results / Procedures / Treatments   Labs (all labs ordered are listed, but only abnormal results are displayed) Labs Reviewed - No data to display  EKG None  Radiology No results found.  Procedures Procedures    Medications Ordered in ED Medications - No data to display  ED Course/ Medical Decision Making/ A&P Clinical Course as of 12/20/21 0226  Sat Dec 20, 2021  0225 Pt's boyfriend to come and pick her up. She is aware of [facility] and other resources. Strict ER return precautions have been discussed, and patient is agreeing with the plan and is comfortable with the workup done and the recommendations from the ER.  [AN]    Clinical Course User Index [AN] Derwood Kaplan, MD                           Medical Decision Making Number and complexity of problems evaluated during this encounter: - High: =1 chronic illnesses with severe exacerbation, progression, or side effects of treatment - Moderate: =1 chronic illnesses with exacerbation, progression, or side effects of treatment  Amount and/or Complexity of Data: - Additional history provided by independent historian: Henrene Dodge, patient's significant other.  Risk of Morbidity, Mortality, or Complications - Risk level: High: Considered consulting psychiatry service for evaluation for inpatient admission.      46 year old female with history of depression comes in with chief complaint of suicidal ideation.  She also has chronic pain syndrome.  Patient suffers from chronic headaches, likely postconcussive syndrome.  She indicates that the last couple of weeks have been especially hard given significant pain and discomfort, that has impacted her work adversely.  She feels  guilty when she does not go to work, given that she cares for her clients.  Today she did not have favorable responses from neurology service or that her insurance company that declined her Botox.  Normally she drinks tequila every Friday, tonight she drank too much and started having some suicidal ideation given the significant stress put on her by the insurance company.  She had called the hotline for help and ended up coming to the ER. She has been in the ER now for little bit more than an hour and a half, and does not want to be here.  She does not want to be IVC.  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  Is aware of [facility].  Indicates that alcohol probably led to her making poor judgment.  Spoke with Mr. Henrene Dodge, patient's significant other.  He also thinks alcohol had an adverse impact.  He is willing to come pick her up as the responsible adult and has agreed to have a conversation with her about taking her to psychiatrist.  He is already put away the gun allegedly.  Patient remained consistent about wanting to go home.  She is showing enough intent to get better, worried about her clients, job, not wanting [place] to IVC and has social support.  She does not have substance use disorder, although alcohol consumption today did hamper her judgment.  Patient was repeatedly given the option of awaiting BH evaluation in the morning, but she feels better going home than staying in the ER.  Stable for discharge and long as some unreliable comes to pick her up, otherwise she will be reassessed in the morning.  Final Clinical Impression(s) / ED Diagnoses Final diagnoses:  Coping with chronic pain  Suicidal ideation    Rx / DC Orders ED Discharge Orders     None         Derwood Kaplan, MD 12/20/21 [phone]

## 2021-12-20 NOTE — ED Notes (Addendum)
Pt sts this is her 2nd suicide attempt in the past 2 weeks. Her last attempt she took all of her medications, and held a gun to her head. She does not have a psychiatrist, and has not mentioned these episodes to her pcp. She does live with her partner, that was home at the time of the attempt tonight. Pt sts she is not suicidal at the moment.

## 2021-12-20 NOTE — ED Triage Notes (Addendum)
Pt BIB [facility] from home for Suicidal Attempt. Per EMS, she took 2 tramadol tablets, drank tequila, and was trying to locate a gun to shoot herself. She called the suicide hotline for help.   BP 140/90 HR 86 RR14 O2 99% r/a CBG 86

## 2021-12-23 NOTE — Telephone Encounter (Signed)
Pt called no answer per DPR left a voice mail She can pick up a sample of Emgality (which includes the 2 injections).  However, if she has another injection site reaction, then we will need to change to another treatment

## 2022-01-13 ENCOUNTER — Telehealth: Payer: Self-pay

## 2022-01-13 NOTE — Telephone Encounter (Signed)
New message   Your information has been sent to [facility].  [facility] (Key: [id number]) Aimovig 140MG /ML auto-injectors   Form [facility] Electronic Prior Authorization Form (2017 NCPDP) Created 1 day ago Sent to Plan 3 minutes ago Plan Response 3 minutes ago Submit Clinical Questions less than a minute ago Determination Wait for Determination Please wait for [facility] 2017 NCPDP to return a determination.

## 2022-01-15 NOTE — Telephone Encounter (Signed)
F/u  [facility] (Key: [id number]) Aimovig 140MG /ML auto-injectors   Form [facility] Electronic Prior Authorization Form (2017 NCPDP) Created 4 days ago Sent to Plan 2 days ago Plan Response 2 days ago Submit Clinical Questions 2 days ago Determination Favorable 2 days ago Message from Plan Request Reference Number: [id number]. AIMOVIG INJ 140MG /ML is approved through 01/13/2023. Your patient may now fill this prescription and it will be covered.

## 2022-03-17 ENCOUNTER — Ambulatory Visit: Payer: 59 | Admitting: Neurology
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
## 2022-04-13 ENCOUNTER — Ambulatory Visit (INDEPENDENT_AMBULATORY_CARE_PROVIDER_SITE_OTHER): Payer: 59 | Admitting: Neurology

## 2022-04-13 ENCOUNTER — Encounter: Payer: Self-pay | Admitting: Neurology

## 2022-04-13 VITALS — BP 138/86 | HR 102 | Ht 60.0 in | Wt 136.4 lb

## 2022-04-13 DIAGNOSIS — R270 Ataxia, unspecified: Secondary | ICD-10-CM

## 2022-04-13 DIAGNOSIS — H5711 Ocular pain, right eye: Secondary | ICD-10-CM

## 2022-04-13 DIAGNOSIS — G43009 Migraine without aura, not intractable, without status migrainosus: Secondary | ICD-10-CM

## 2022-04-13 NOTE — Progress Notes (Signed)
Medication Samples have been provided to the patient. ? ?Drug name: Nurtec       Strength: 75 mg        Qty: 2   LOT: [id number]  Exp.Date: 01/2024 ? ?Dosing instructions: as needed ? ?The patient has been instructed regarding the correct time, dose, and frequency of taking this medication, including desired effects and most common side effects.  ? ?Leida Lauth ?11:24 AM ?04/13/2022  ?

## 2022-04-13 NOTE — Patient Instructions (Signed)
Start nortriptyline 10mg  at bedtime.  Stop tramadol ?Continue topiramate and Emgality ?Stop Ubrelvy.  Try Nurtec (1 in 24 hours).  If effective, contact me for prescription ?Check MRI of brain without contrast ?Follow up 6 months. ?

## 2022-04-30 ENCOUNTER — Ambulatory Visit
Admission: RE | Admit: 2022-04-30 | Discharge: 2022-04-30 | Disposition: A | Discharge status: Home / Self Care | Payer: 59 | Source: Ambulatory Visit | Attending: Neurology | Admitting: Neurology

## 2022-04-30 ENCOUNTER — Telehealth: Payer: Self-pay | Admitting: Neurology

## 2022-04-30 DIAGNOSIS — G43009 Migraine without aura, not intractable, without status migrainosus: Secondary | ICD-10-CM

## 2022-04-30 DIAGNOSIS — R270 Ataxia, unspecified: Secondary | ICD-10-CM

## 2022-04-30 IMAGING — MR MR HEAD W/O CM
11 series · 48 of 48 positions shown · non-contrast
Comparison: [DATE]

CLINICAL DATA: Headache, new or worsening, neuro deficit (Age
18-49y)

EXAM:
MRI HEAD WITHOUT CONTRAST
TECHNIQUE: Multiplanar, multiecho pulse sequences of the brain and surrounding
structures were obtained without intravenous contrast.

[Series 5: T1 · sagittal · 4.0mm · 0.75mm/px · 2 of 31 slices shown (1 of 2)]
[im 1/31]
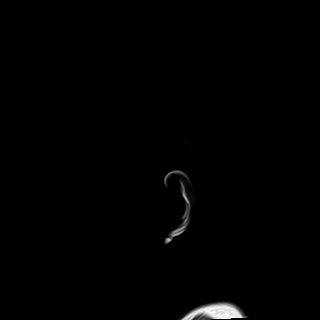
[im 31/31]
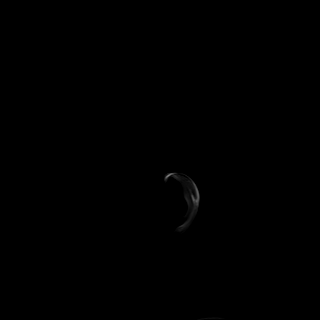

[Series 6: DWI · axial · 3.0mm · 0.94mm/px · z∈[-83,+57]mm · 10 of 160 slices shown (1 of 3)]
[im 1/160]
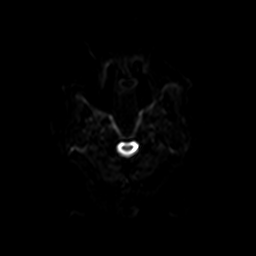
[im 18/160]
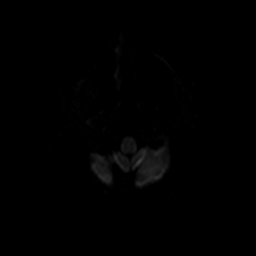
[im 36/160]
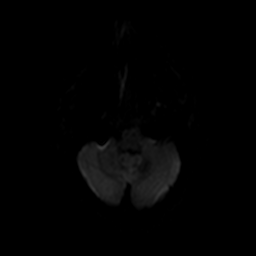
[im 54/160]
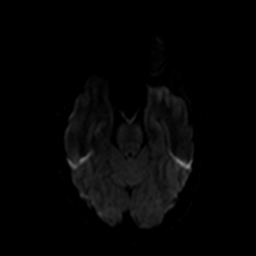
[im 71/160]
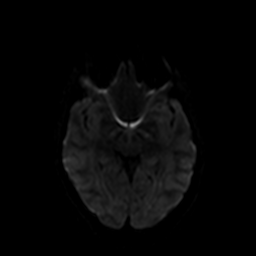
[im 89/160]
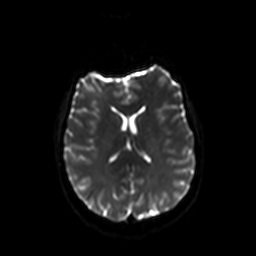
[im 107/160]
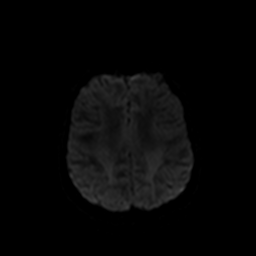
[im 124/160]
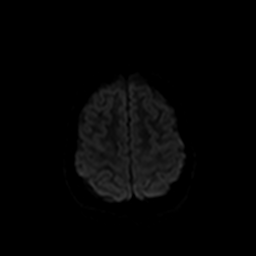
[im 142/160]
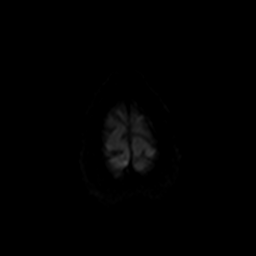
[im 160/160]
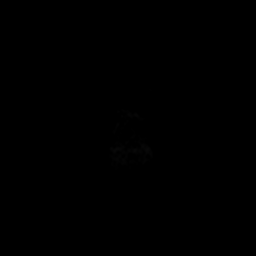

[Series 7: ax dwi_tracew · axial · 3.0mm · 0.94mm/px · z∈[-83,+57]mm · 5 of 80 slices shown]
[im 1/80]
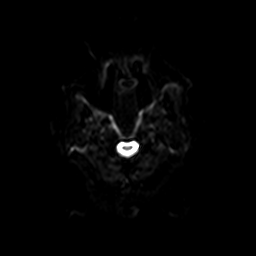
[im 20/80]
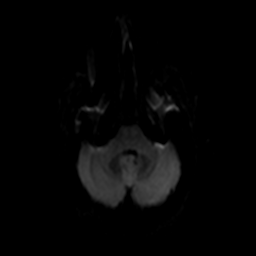
[im 40/80]
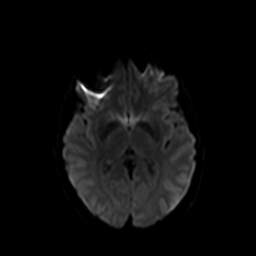
[im 60/80]
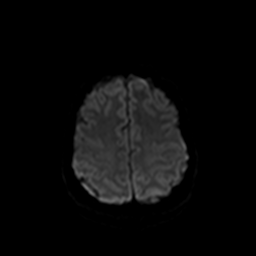
[im 80/80]
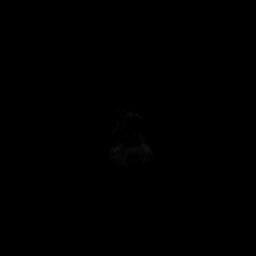

[Series 8: ax dwi_adc · axial · 3.0mm · 0.94mm/px · z∈[-83,+57]mm · 3 of 40 slices shown]
[im 1/40]
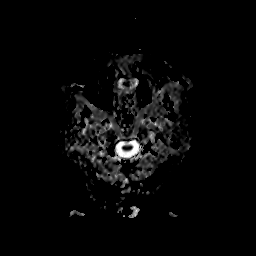
[im 20/40]
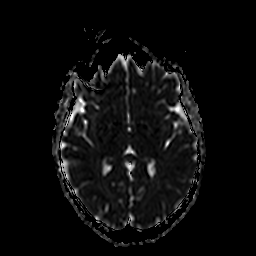
[im 40/40]
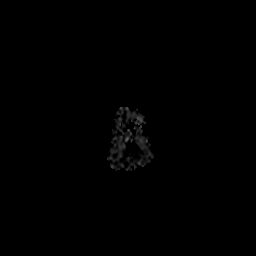

[Series 9: DWI · coronal · 5.0mm · 1.44mm/px · 4 of 60 slices shown (2 of 3)]
[im 1/60]
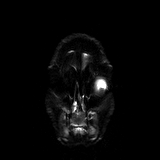
[im 20/60]
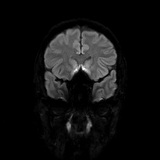
[im 40/60]
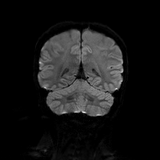
[im 60/60]
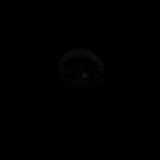

[Series 10: DWI · coronal · 5.0mm · 1.44mm/px · 2 of 30 slices shown (3 of 3)]
[im 1/30]
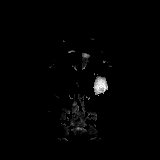
[im 30/30]
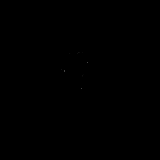

[Series 11: T2 · axial · 4.0mm · 0.36mm/px · z∈[-75,+60]mm · 2 of 27 slices shown (1 of 2)]
[im 1/27]
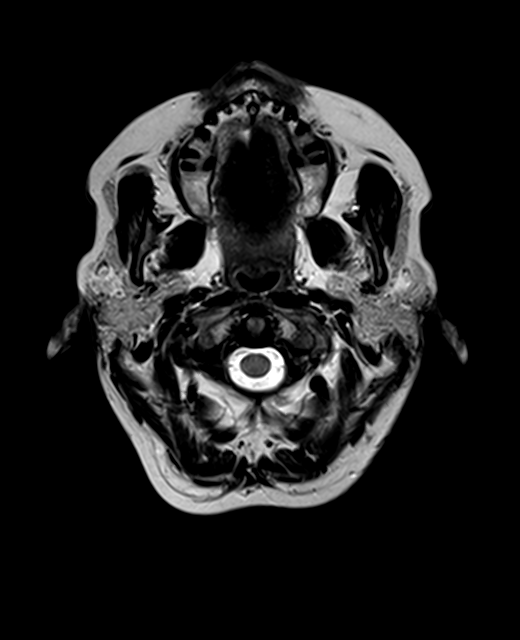
[im 27/27]
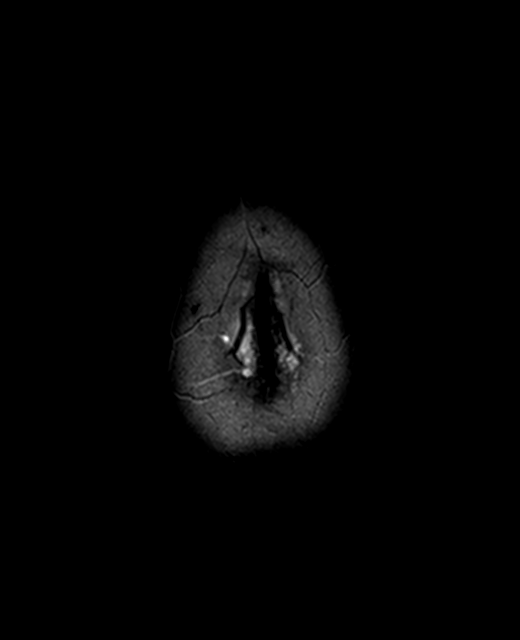

[Series 12: FLAIR · axial · 3.0mm · 0.72mm/px · z∈[-84,+66]mm · 2 of 26 slices shown]
[im 1/26]
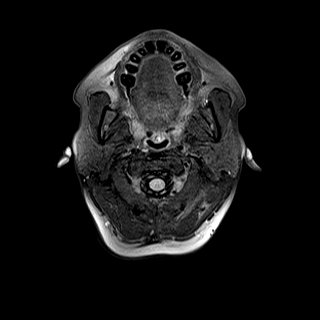
[im 26/26]
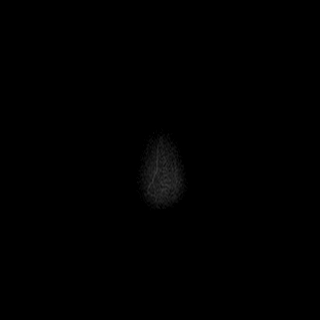

[Series 14: swi_images · axial · 1.5mm · 0.90mm/px · z∈[-78,+64]mm · 6 of 96 slices shown]
[im 1/96]
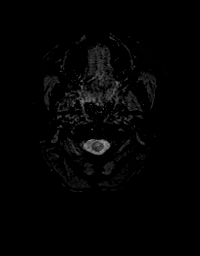
[im 20/96]
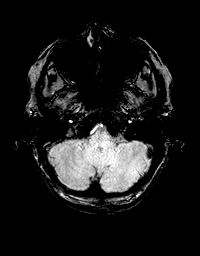
[im 39/96]
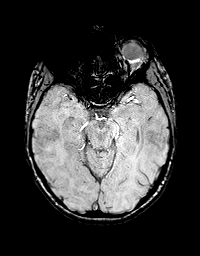
[im 58/96]
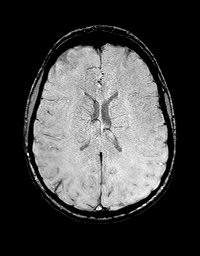
[im 77/96]
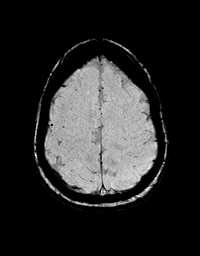
[im 96/96]
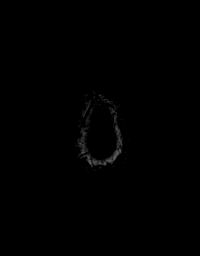

[Series 15: T1 · axial · 1.0mm · 0.94mm/px · z∈[-96,+60]mm · 10 of 157 slices shown (2 of 2)]
[im 1/157]
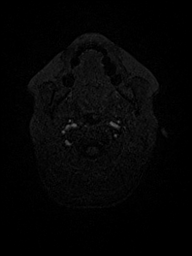
[im 18/157]
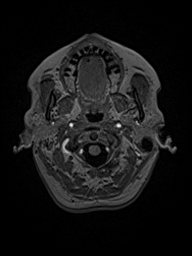
[im 35/157]
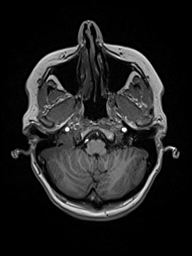
[im 53/157]
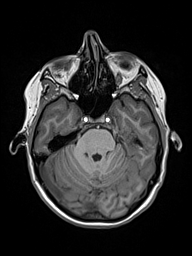
[im 70/157]
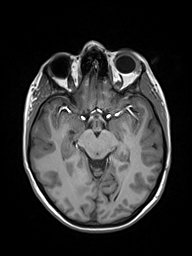
[im 87/157]
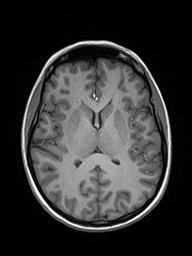
[im 105/157]
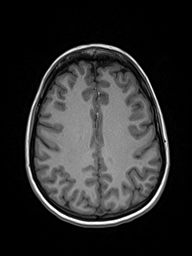
[im 122/157]
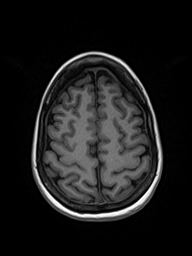
[im 139/157]
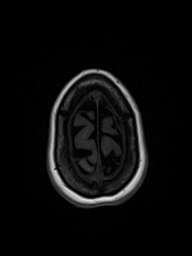
[im 157/157]
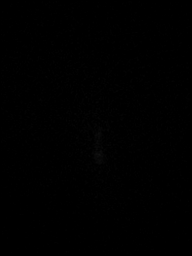

[Series 16: T2 · coronal · 4.5mm · 0.36mm/px · 2 of 30 slices shown (2 of 2)]
[im 1/30]
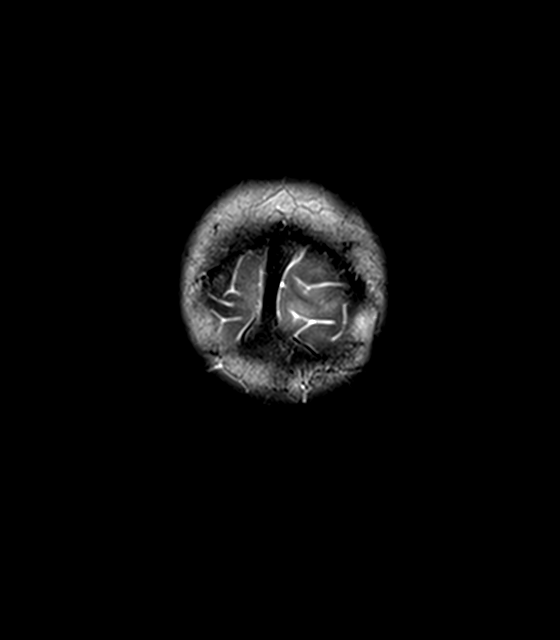
[im 30/30]
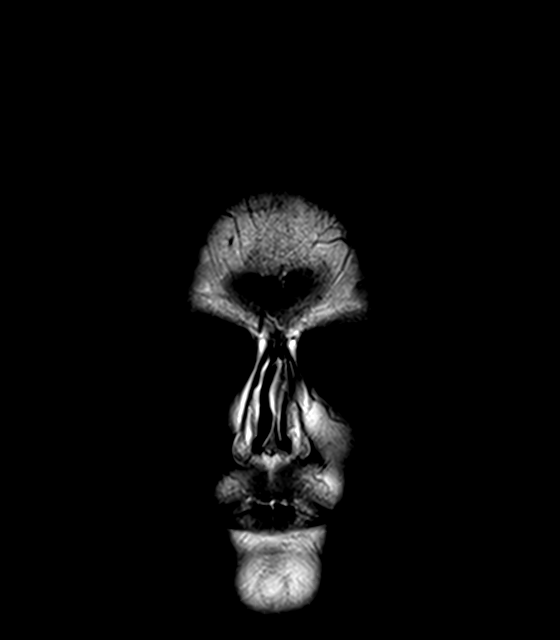

[48 of 48 positions shown; findings below may reference images not displayed]

FINDINGS: Brain: There is no acute infarction or intracranial hemorrhage.
There is no intracranial mass, mass effect, or edema. There is no
hydrocephalus or extra-axial fluid collection. Ventricles and sulci
are normal in size and configuration.

Vascular: Major vessel flow voids at the skull base are preserved.

Skull and upper cervical spine: Normal marrow signal is preserved.

Sinuses/Orbits: Paranasal sinuses are aerated. Right globe
prosthesis.

Other: Sella is unremarkable.  Mastoid air cells are clear.
IMPRESSION: No new or significant intracranial abnormality.

## 2022-04-30 NOTE — Telephone Encounter (Signed)
Pt called in stating the Nurtec samples were working well and would like to have a prescription sent in for it. She would like it sent to [facility] on [address] and [address]

## 2022-05-01 ENCOUNTER — Other Ambulatory Visit: Payer: Self-pay | Admitting: Neurology

## 2022-05-01 MED ORDER — NURTEC 75 MG PO TBDP: 75.0000 mg | ORAL_TABLET | Freq: Every day | ORAL | 5 refills | Status: DC | PRN

## 2022-05-01 NOTE — Telephone Encounter (Signed)
done

## 2022-05-05 NOTE — Progress Notes (Signed)
Patient advised of her MRI results. Nurtec called into the Pharmacy on 05/01/22.

## 2022-05-08 ENCOUNTER — Telehealth (HOSPITAL_COMMUNITY): Payer: Self-pay | Admitting: Pharmacy Technician

## 2022-05-08 ENCOUNTER — Other Ambulatory Visit (HOSPITAL_COMMUNITY): Payer: Self-pay

## 2022-05-08 NOTE — Telephone Encounter (Signed)
Patient Advocate Encounter   Received notification from that prior authorization for Nurtec 75MG  dispersible tablets is required.   PA submitted on 05/08/2022 Key 07/08/2022 Status is pending       [id number], CPhT Pharmacy Patient Advocate Specialist [facility] Patient Advocate Team Direct Number: [phone]  Fax: [phone]

## 2022-05-08 NOTE — Telephone Encounter (Signed)
Medication Samples have been provided to the patient.  Drug name: Nurtec       Strength: 75 mg        Qty: 4  LOT: [id number]  Exp.Date: 01/2024  Dosing instructions: as needed  The patient has been instructed regarding the correct time, dose, and frequency of taking this medication, including desired effects and most common side effects.   [name] 12:09 PM 05/08/2022

## 2022-05-08 NOTE — Telephone Encounter (Signed)
Patient called and said her pharmacy does not have her Nurtec ready for her yet, she will call them now.  In the meantime, patient is requesting another sample of Nurtec if possible. She's concerned about getting a migraine over the weekend and not having the needed medication.

## 2022-05-11 ENCOUNTER — Telehealth: Payer: Self-pay | Admitting: Pharmacy Technician

## 2022-05-11 DIAGNOSIS — G8929 Other chronic pain: Secondary | ICD-10-CM

## 2022-05-11 NOTE — Telephone Encounter (Signed)
Received notification from [facility] regarding a prior authorization for  Nurtec 75mg  . Authorization has been APPROVED from 05/08/22 to 05/09/23.   Authorization # Key: 07/09/23 - PA Case ID: [id number]

## 2022-05-11 NOTE — Telephone Encounter (Signed)
Submitted a Prior Authorization request to [facility] for  Botox  via CoverMyMeds. Will update once we receive a response.   (Key: [id number]) - [id number]  Submitted benefits in [employer] Portal: [id number]

## 2022-05-11 NOTE — Telephone Encounter (Signed)
-----   Message from Leida Lauth, [place] sent at 05/08/2022 11:59 AM EDT ----- Regarding: BOtox PA Please start PA for Botox

## 2022-05-12 NOTE — Telephone Encounter (Signed)
Botox is not covered through patient's pharmacy benefit. Will submit through patient's medical plan

## 2022-05-12 NOTE — Telephone Encounter (Signed)
[employer] Botox PA approval- Medical Benefit- Auth# [id number] [employer] Benefits

## 2022-05-14 NOTE — Telephone Encounter (Signed)
Patient approved for Buy/Bill or to received Med from [facility]

## 2022-05-18 MED ORDER — BOTOX 200 UNITS IJ SOLR: INTRAMUSCULAR | 4 refills | Status: DC

## 2022-05-20 MED ORDER — BOTOX 200 UNITS IJ SOLR: INTRAMUSCULAR | 4 refills | Status: DC

## 2022-05-20 NOTE — Addendum Note (Signed)
Addended by: Leida Lauth on: 05/20/2022 09:02 AM   Modules accepted: Orders

## 2022-06-18 NOTE — Telephone Encounter (Signed)
Spoke to [employer], Order Pending waiting on PA investigation of PA.   Will call us back once complete.

## 2022-06-19 ENCOUNTER — Encounter: Payer: Self-pay | Admitting: Neurology

## 2022-07-17 ENCOUNTER — Ambulatory Visit (INDEPENDENT_AMBULATORY_CARE_PROVIDER_SITE_OTHER): Payer: 59 | Admitting: Neurology

## 2022-07-17 DIAGNOSIS — G43009 Migraine without aura, not intractable, without status migrainosus: Secondary | ICD-10-CM | POA: Diagnosis not present

## 2022-07-17 MED ORDER — ONABOTULINUMTOXINA 200 UNITS IJ SOLR
200.0000 [IU] | Freq: Once | INTRAMUSCULAR | Status: AC
  Administered 2022-07-17: 155 [IU] via INTRAMUSCULAR

## 2022-07-17 NOTE — Progress Notes (Signed)
[facility]  ° °Procedure Note Botox ° °Attending: Dr. Kaylum Shrum ° °Preoperative Diagnosis(es): Chronic migraine ° °Consent obtained from: The patient °Benefits discussed included, but were not limited to decreased muscle tightness, increased joint range of motion, and decreased pain.  Risk discussed included, but were not limited pain and discomfort, bleeding, bruising, excessive weakness, venous thrombosis, muscle atrophy and dysphagia.  Anticipated outcomes of the procedure as well as he risks and benefits of the alternatives to the procedure, and the roles and tasks of the personnel to be involved, were discussed with the patient, and the patient consents to the procedure and agrees to proceed. A copy of the patient medication guide was given to the patient which explains the blackbox warning. ° °Patients identity and treatment sites confirmed Yes.  . ° °Details of Procedure: °Skin was cleaned with alcohol. Prior to injection, the needle plunger was aspirated to make sure the needle was not within a blood vessel.  There was no blood retrieved on aspiration.   ° °Following is a summary of the muscles injected  And the amount of Botulinum toxin used: ° °Dilution °200 units of Botox was reconstituted with 4 ml of preservative free normal saline. °Time of reconstitution: At the time of the office visit (<30 minutes prior to injection)  ° °Injections  °155 total units of Botox was injected with a 30 gauge needle. ° °Injection Sites: °L occipitalis: 15 units- 3 sites  °R occiptalis: 15 units- 3 sites ° °L upper trapezius: 15 units- 3 sites °R upper trapezius: 15 units- 3 sits          °L paraspinal: 10 units- 2 sites °R paraspinal: 10 units- 2 sites ° °Face °L frontalis(2 injection sites):10 units   °R frontalis(2 injection sites):10 units         °L corrugator: 5 units   °R corrugator: 5 units           °Procerus: 5 units   °L temporalis: 20 units °R temporalis: 20 units  ° °Agent:  °200 units of botulinum Type  A (Onobotulinum Toxin type A) was reconstituted with 4 ml of preservative free normal saline.  °Time of reconstitution: At the time of the office visit (<30 minutes prior to injection)  ° ° ° Total injected (Units):  155 ° Total wasted (Units):  45 ° °Patient tolerated procedure well without complications.   °Reinjection is anticipated in 3 months. ° ° °

## 2022-08-04 ENCOUNTER — Encounter: Payer: Self-pay | Admitting: Neurology

## 2022-08-05 ENCOUNTER — Encounter: Payer: Self-pay | Admitting: Neurology

## 2022-08-05 ENCOUNTER — Telehealth: Payer: Self-pay | Admitting: Neurology

## 2022-08-05 MED ORDER — PREDNISONE 10 MG (21) PO TBPK: ORAL_TABLET | ORAL | 0 refills | Status: AC

## 2022-08-05 NOTE — Telephone Encounter (Signed)
Patient would like to have prednisone taper. Do not have a ride for the headache cocktail.    Patient advised letter ready in mychart, if she unable to get it there call the office back and it will be ready at the front desk for pickup.

## 2022-08-05 NOTE — Telephone Encounter (Signed)
Telephone call to patient, Patient would like to try a prednisone taper or headache cocktail to see if something will help break the headache.

## 2022-08-05 NOTE — Telephone Encounter (Signed)
Patient does not think her nurtec is working for the past week she has had daily migraines and once she takes the medication it goes away in about 2 hours but in about 4 to 8 hours it comes back. She would like to speak to someone about this

## 2022-08-05 NOTE — Telephone Encounter (Signed)
LMOVM for patient, Please give the office call.

## 2022-08-06 ENCOUNTER — Other Ambulatory Visit: Payer: Self-pay | Admitting: Neurology

## 2022-08-10 ENCOUNTER — Encounter: Payer: Self-pay | Admitting: Neurology

## 2022-08-13 ENCOUNTER — Encounter: Payer: Self-pay | Admitting: Neurology

## 2022-08-18 ENCOUNTER — Encounter: Payer: Self-pay | Admitting: Neurology

## 2022-09-01 ENCOUNTER — Other Ambulatory Visit (HOSPITAL_COMMUNITY): Payer: Self-pay

## 2022-09-01 ENCOUNTER — Encounter: Payer: Self-pay | Admitting: Neurology

## 2022-09-02 ENCOUNTER — Telehealth: Payer: Self-pay

## 2022-09-02 ENCOUNTER — Encounter: Payer: Self-pay | Admitting: Neurology

## 2022-09-02 NOTE — Telephone Encounter (Signed)
Medication Samples have been provided to the patient.  Drug name: [name]        Strength: .75 mg        Qty: 2   LOT: [id number]  Exp.Date: 10/29/22  Dosing instructions: as needed  The patient has been instructed regarding the correct time, dose, and frequency of taking this medication, including desired effects and most common side effects.   [name] 3:40 PM 09/02/2022

## 2022-09-03 ENCOUNTER — Other Ambulatory Visit (HOSPITAL_COMMUNITY): Payer: Self-pay

## 2022-09-03 ENCOUNTER — Telehealth: Payer: Self-pay

## 2022-09-03 DIAGNOSIS — R519 Headache, unspecified: Secondary | ICD-10-CM

## 2022-09-03 NOTE — Telephone Encounter (Signed)
PA team per patient she needs a new PA for Nurtec under her new insurance.  Also I sent over a Mychart message to you guys for PA for Botox.

## 2022-09-03 NOTE — Telephone Encounter (Signed)
Received notification that prior authorization is required for Nurtec 75MG  dispersible tablets.  PA submitted and APPROVED on 09-03-2022.  Key [id number] Effective: 09-03-2022 to 09-03-2023

## 2022-09-11 ENCOUNTER — Other Ambulatory Visit (HOSPITAL_COMMUNITY): Payer: Self-pay

## 2022-09-11 ENCOUNTER — Telehealth: Payer: Self-pay | Admitting: Pharmacy Technician

## 2022-09-11 NOTE — Telephone Encounter (Signed)
Insurance change-  Submitted a Prior Authorization request to [employer] for  Botox 200 unit  via Fax. Will update once we receive a response.   Phone# [phone] Fax# [phone]

## 2022-09-11 NOTE — Telephone Encounter (Signed)
[employer] benefits submitted.  Key: [id number]

## 2022-09-15 NOTE — Telephone Encounter (Signed)
   [employer] benefits verification scanned to chart 

## 2022-09-16 ENCOUNTER — Encounter: Payer: Self-pay | Admitting: Neurology

## 2022-09-16 ENCOUNTER — Telehealth: Payer: Self-pay

## 2022-09-16 NOTE — Telephone Encounter (Signed)
Per patient, Hello, I have called [facility] to update my insurance.  They will be sending you a prior authorization for Botox. Just wanted to let you know.    Thank you    Cathrine Muster

## 2022-09-23 ENCOUNTER — Encounter: Payer: Self-pay | Admitting: Neurology

## 2022-09-24 NOTE — Telephone Encounter (Signed)
Received notification from [place] regarding a prior authorization for  Botox 200 unit . Authorization has been APPROVED from 09/11/22 to 09/12/23. Patient must fill through [facility].   Approved quantity 800 units.  Letter is in process and will be sent to the office  Authorization # [id number] Phone # [phone]

## 2022-09-24 NOTE — Telephone Encounter (Signed)
Please check status of PA-   Phone# [phone]

## 2022-09-25 ENCOUNTER — Other Ambulatory Visit: Payer: Self-pay | Admitting: Neurology

## 2022-09-25 ENCOUNTER — Other Ambulatory Visit: Payer: Self-pay

## 2022-09-25 DIAGNOSIS — R519 Headache, unspecified: Secondary | ICD-10-CM

## 2022-09-25 MED ORDER — BOTOX 200 UNITS IJ SOLR: INTRAMUSCULAR | 4 refills | Status: AC

## 2022-09-25 MED ORDER — BOTOX 200 UNITS IJ SOLR: INTRAMUSCULAR | 4 refills | Status: DC

## 2022-09-25 NOTE — Telephone Encounter (Signed)
Botox 200 units sent to [facility]. Mychart message sent to Patient to advise of new Pharmacy.

## 2022-09-25 NOTE — Addendum Note (Signed)
Addended by: [name] on: 09/25/2022 09:18 AM   Modules accepted: Orders

## 2022-09-25 NOTE — Progress Notes (Signed)
Medication Samples have been provided to the patient.  Drug name: Samson Frederic        Strength: 0.75 mg        Qty: 2  LOT: [id number]  Exp.Date: 11/23  Dosing instructions: as needed  The patient has been instructed regarding the correct time, dose, and frequency of taking this medication, including desired effects and most common side effects.   [name] 9:49 AM 09/25/2022

## 2022-09-28 ENCOUNTER — Emergency Department (HOSPITAL_COMMUNITY): Payer: Commercial Managed Care - HMO

## 2022-09-28 ENCOUNTER — Other Ambulatory Visit: Payer: Self-pay

## 2022-09-28 ENCOUNTER — Telehealth: Payer: Self-pay | Admitting: Neurology

## 2022-09-28 ENCOUNTER — Emergency Department (HOSPITAL_COMMUNITY)
Admission: EM | Admit: 2022-09-28 | Discharge: 2022-09-28 | Discharge status: Against Medical Advice | Payer: Commercial Managed Care - HMO | Attending: Medical | Admitting: Medical

## 2022-09-28 DIAGNOSIS — Z5321 Procedure and treatment not carried out due to patient leaving prior to being seen by health care provider: Secondary | ICD-10-CM | POA: Insufficient documentation

## 2022-09-28 DIAGNOSIS — Y92410 Unspecified street and highway as the place of occurrence of the external cause: Secondary | ICD-10-CM | POA: Insufficient documentation

## 2022-09-28 DIAGNOSIS — S0990XA Unspecified injury of head, initial encounter: Secondary | ICD-10-CM | POA: Diagnosis present

## 2022-09-28 MED ORDER — ONDANSETRON 4 MG PO TBDP: 4.0000 mg | ORAL_TABLET | Freq: Once | ORAL | Status: DC

## 2022-09-28 NOTE — ED Triage Notes (Signed)
Pt reports mechanical fall on Saturday. Pt hit head on floor. Denies loc and blood thinner usage. Patient nauseated in triage.

## 2022-09-28 NOTE — Telephone Encounter (Signed)
Pt came in to pick up samples and wanted to let Dr. Tomi Likens know she fell on Saturday and hit her forehead. She didn't go see anyone about it. She is having nausea and dizziness.
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
## 2022-09-29 ENCOUNTER — Encounter: Payer: Self-pay | Admitting: Neurology

## 2022-09-29 NOTE — Telephone Encounter (Signed)
LMOVM for patient to call the office back.   Per Dr.Jaffe,  If she wants to schedule an appointment, she can.  SHe may have had a slight concussion.  If she feels symptoms (dizziness, nausea, vomiting, vision changes, confusion, headache) are getting worse, then she should be seen either by me or at urgent care.  Otherwise, she should just rest and monitor that symptoms steadily improve

## 2022-10-01 ENCOUNTER — Ambulatory Visit: Payer: 59 | Admitting: Neurology

## 2022-10-02 ENCOUNTER — Encounter: Payer: Self-pay | Admitting: Neurology

## 2022-10-02 ENCOUNTER — Telehealth: Payer: Self-pay

## 2022-10-02 ENCOUNTER — Other Ambulatory Visit: Payer: Self-pay

## 2022-10-02 DIAGNOSIS — G8929 Other chronic pain: Secondary | ICD-10-CM

## 2022-10-02 MED ORDER — TRUDHESA 0.725 MG/ACT NA AERS: 0.7250 mg | INHALATION_SPRAY | NASAL | 5 refills | Status: DC | PRN

## 2022-10-02 NOTE — Telephone Encounter (Signed)
Per JENNIFER at [facility]   Thank you, I see that this is processing, it's in the last stage of Pharmacist Verification

## 2022-10-06 ENCOUNTER — Encounter: Payer: Self-pay | Admitting: Neurology

## 2022-10-07 ENCOUNTER — Other Ambulatory Visit (HOSPITAL_COMMUNITY): Payer: Self-pay

## 2022-10-07 ENCOUNTER — Encounter: Payer: Self-pay | Admitting: Neurology

## 2022-10-07 ENCOUNTER — Telehealth: Payer: Self-pay

## 2022-10-07 NOTE — Telephone Encounter (Signed)
Pharmacy Patient Advocate Encounter   Received notification from [facility] that prior authorization for Trudhesa 0.725mg /act is required/requested.     PA submitted on 10/07/2022 to [facility] via CoverMyMeds  Key: [id number] PA Case ID: [id number]  Status is pending

## 2022-10-08 ENCOUNTER — Other Ambulatory Visit: Payer: Self-pay

## 2022-10-08 ENCOUNTER — Telehealth: Payer: Self-pay

## 2022-10-08 ENCOUNTER — Other Ambulatory Visit (HOSPITAL_COMMUNITY): Payer: Self-pay

## 2022-10-08 NOTE — Telephone Encounter (Signed)
Per Mikey College from [facility]: Thank you so much for your patience. [place]'s prescription is processing, I am going to submit a request to escalate the benefit verification.

## 2022-10-08 NOTE — Telephone Encounter (Signed)
Medication Samples have been provided to the patient.  Drug name: Trudhesa       Strength: 0.725mg         Qty: 1 box  LOT: [id number]  Exp.Date: 10/29/22  Dosing instructions: Place 0.725 mg into the nose as needed.   The patient has been instructed regarding the correct time, dose, and frequency of taking this medication, including desired effects and most common side effects.   Lenise Herald 3:57 PM 10/08/2022

## 2022-10-08 NOTE — Telephone Encounter (Signed)
Fax received from [place] denied, nothing to support that a alternative has been used.

## 2022-10-14 ENCOUNTER — Ambulatory Visit: Payer: 59 | Admitting: Neurology

## 2022-10-16 ENCOUNTER — Ambulatory Visit: Payer: Commercial Managed Care - HMO | Admitting: Neurology

## 2022-10-16 DIAGNOSIS — R519 Headache, unspecified: Secondary | ICD-10-CM | POA: Diagnosis not present

## 2022-10-16 DIAGNOSIS — G8929 Other chronic pain: Secondary | ICD-10-CM | POA: Diagnosis not present

## 2022-10-16 MED ORDER — ONABOTULINUMTOXINA 100 UNITS IJ SOLR
200.0000 [IU] | Freq: Once | INTRAMUSCULAR | Status: AC
  Administered 2022-10-16: 155 [IU] via INTRAMUSCULAR

## 2022-10-16 NOTE — Progress Notes (Signed)
a 

## 2022-10-16 NOTE — Progress Notes (Signed)
[facility]  ° °Procedure Note Botox ° °Attending: Dr. Suhas Estis ° °Preoperative Diagnosis(es): Chronic migraine ° °Consent obtained from: The patient °Benefits discussed included, but were not limited to decreased muscle tightness, increased joint range of motion, and decreased pain.  Risk discussed included, but were not limited pain and discomfort, bleeding, bruising, excessive weakness, venous thrombosis, muscle atrophy and dysphagia.  Anticipated outcomes of the procedure as well as he risks and benefits of the alternatives to the procedure, and the roles and tasks of the personnel to be involved, were discussed with the patient, and the patient consents to the procedure and agrees to proceed. A copy of the patient medication guide was given to the patient which explains the blackbox warning. ° °Patients identity and treatment sites confirmed Yes.  . ° °Details of Procedure: °Skin was cleaned with alcohol. Prior to injection, the needle plunger was aspirated to make sure the needle was not within a blood vessel.  There was no blood retrieved on aspiration.   ° °Following is a summary of the muscles injected  And the amount of Botulinum toxin used: ° °Dilution °200 units of Botox was reconstituted with 4 ml of preservative free normal saline. °Time of reconstitution: At the time of the office visit (<30 minutes prior to injection)  ° °Injections  °155 total units of Botox was injected with a 30 gauge needle. ° °Injection Sites: °L occipitalis: 15 units- 3 sites  °R occiptalis: 15 units- 3 sites ° °L upper trapezius: 15 units- 3 sites °R upper trapezius: 15 units- 3 sits          °L paraspinal: 10 units- 2 sites °R paraspinal: 10 units- 2 sites ° °Face °L frontalis(2 injection sites):10 units   °R frontalis(2 injection sites):10 units         °L corrugator: 5 units   °R corrugator: 5 units           °Procerus: 5 units   °L temporalis: 20 units °R temporalis: 20 units  ° °Agent:  °200 units of botulinum Type  A (Onobotulinum Toxin type A) was reconstituted with 4 ml of preservative free normal saline.  °Time of reconstitution: At the time of the office visit (<30 minutes prior to injection)  ° ° ° Total injected (Units):  155 ° Total wasted (Units):  45 ° °Patient tolerated procedure well without complications.   °Reinjection is anticipated in 3 months. ° ° °

## 2022-11-09 NOTE — Telephone Encounter (Signed)
Received notification from [employer] regarding a prior authorization for  Botox 200 unit . Authorization has been APPROVED from 09-11-22 to 09-12-23.   Patient must fill through [facility]: [phone]. Requested approval letter be faxed over.  Authorization # [id number] Phone # [phone]

## 2022-12-02 ENCOUNTER — Encounter: Payer: Self-pay | Admitting: Neurology

## 2022-12-07 ENCOUNTER — Other Ambulatory Visit: Payer: Self-pay

## 2022-12-07 ENCOUNTER — Telehealth: Payer: Self-pay

## 2022-12-07 ENCOUNTER — Other Ambulatory Visit (HOSPITAL_COMMUNITY): Payer: Self-pay

## 2022-12-07 NOTE — Telephone Encounter (Signed)
Pt called in needing a PA for Nurtec 75 mg.

## 2022-12-07 NOTE — Telephone Encounter (Signed)
PA not needed. Current PA doesn't expire until 10.4.24. Pt just needs refills sent in to pharmacy.

## 2022-12-08 ENCOUNTER — Other Ambulatory Visit: Payer: Self-pay

## 2022-12-08 MED ORDER — NURTEC 75 MG PO TBDP: 75.0000 mg | ORAL_TABLET | Freq: Every day | ORAL | 0 refills | Status: DC | PRN

## 2022-12-08 NOTE — Telephone Encounter (Signed)
Thank you refill for one month has an appointment in January

## 2022-12-08 NOTE — Telephone Encounter (Signed)
Called to tell med refilled for 1 month and appointment changed to 1/25

## 2022-12-09 NOTE — Telephone Encounter (Signed)
Message left  on vv about new change in appointment and refill.

## 2022-12-11 ENCOUNTER — Ambulatory Visit: Payer: Commercial Managed Care - HMO | Admitting: Neurology

## 2022-12-17 ENCOUNTER — Ambulatory Visit: Payer: 59 | Admitting: Neurology

## 2022-12-21 ENCOUNTER — Encounter: Payer: Self-pay | Admitting: Neurology

## 2022-12-24 ENCOUNTER — Ambulatory Visit: Payer: 59 | Admitting: Neurology

## 2023-01-01 ENCOUNTER — Telehealth: Payer: 59 | Admitting: Neurology

## 2023-01-15 ENCOUNTER — Ambulatory Visit: Payer: Commercial Managed Care - HMO | Admitting: Neurology
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
## 2023-01-20 ENCOUNTER — Encounter: Payer: Self-pay | Admitting: Neurology

## 2023-01-20 ENCOUNTER — Telehealth (INDEPENDENT_AMBULATORY_CARE_PROVIDER_SITE_OTHER): Payer: Commercial Managed Care - HMO | Admitting: Neurology

## 2023-01-20 DIAGNOSIS — Z029 Encounter for administrative examinations, unspecified: Secondary | ICD-10-CM

## 2023-01-20 DIAGNOSIS — R519 Headache, unspecified: Secondary | ICD-10-CM

## 2023-01-20 DIAGNOSIS — G8929 Other chronic pain: Secondary | ICD-10-CM

## 2023-01-25 ENCOUNTER — Telehealth: Payer: Self-pay

## 2023-01-25 NOTE — Telephone Encounter (Signed)
Per Dr.Jaffe,  I would like to add Qulipta '60mg'$  daily.  My reasoning is that she has had decrease in headache frequency except for the last month between Botox sessions.  By adding Lenoria Chime, we hope to carry decrease migraine frequency over the entire 3 months between Botox.  She has tried and failed multiple other preventative medications such as Aimovig, Emgality, topiramate, venlafaxine, tricyclic antidepressant and beta blocker.    Pa team please start a PA for [facility]

## 2023-01-29 ENCOUNTER — Other Ambulatory Visit (HOSPITAL_COMMUNITY): Payer: Self-pay

## 2023-01-29 ENCOUNTER — Other Ambulatory Visit: Payer: Self-pay | Admitting: Neurology

## 2023-01-29 NOTE — Telephone Encounter (Signed)
Patient Advocate Encounter   Received notification that prior authorization for Qulipta '60MG'$  tablets is required.   PA submitted on 01/29/2023 [facility] [employer] Electronic PA Form Status is pending       Lyndel Safe, [place] Patient Advocate Specialist [facility] Patient Advocate Team Direct Number: [phone]  Fax: [phone]

## 2023-02-04 NOTE — Telephone Encounter (Signed)
Patient Advocate Encounter  Received a fax from [place] regarding Prior Authorization for Qulipta '60MG'$  tablets.  Key [place] has been DENIED due to

## 2023-02-05 ENCOUNTER — Ambulatory Visit: Payer: Commercial Managed Care - HMO | Admitting: Neurology

## 2023-02-08 NOTE — Telephone Encounter (Signed)
Expedited appeal submitted with provider reasoning. Should receive response in 72 hours.  Phone# [phone]

## 2023-02-12 ENCOUNTER — Ambulatory Visit: Payer: Commercial Managed Care - HMO | Admitting: Neurology

## 2023-02-12 DIAGNOSIS — G8929 Other chronic pain: Secondary | ICD-10-CM

## 2023-02-12 DIAGNOSIS — R519 Headache, unspecified: Secondary | ICD-10-CM | POA: Diagnosis not present

## 2023-02-12 MED ORDER — ONABOTULINUMTOXINA 100 UNITS IJ SOLR: 200.0000 [IU] | Freq: Once | INTRAMUSCULAR | Status: AC

## 2023-02-12 NOTE — Progress Notes (Signed)
Botulinum Clinic  ° °Procedure Note Botox ° °Attending: Dr. Bera Pinela ° °Preoperative Diagnosis(es): Chronic migraine ° °Consent obtained from: The patient °Benefits discussed included, but were not limited to decreased muscle tightness, increased joint range of motion, and decreased pain.  Risk discussed included, but were not limited pain and discomfort, bleeding, bruising, excessive weakness, venous thrombosis, muscle atrophy and dysphagia.  Anticipated outcomes of the procedure as well as he risks and benefits of the alternatives to the procedure, and the roles and tasks of the personnel to be involved, were discussed with the patient, and the patient consents to the procedure and agrees to proceed. A copy of the patient medication guide was given to the patient which explains the blackbox warning. ° °Patients identity and treatment sites confirmed Yes.  . ° °Details of Procedure: °Skin was cleaned with alcohol. Prior to injection, the needle plunger was aspirated to make sure the needle was not within a blood vessel.  There was no blood retrieved on aspiration.   ° °Following is a summary of the muscles injected  And the amount of Botulinum toxin used: ° °Dilution °200 units of Botox was reconstituted with 4 ml of preservative free normal saline. °Time of reconstitution: At the time of the office visit (<30 minutes prior to injection)  ° °Injections  °155 total units of Botox was injected with a 30 gauge needle. ° °Injection Sites: °L occipitalis: 15 units- 3 sites  °R occiptalis: 15 units- 3 sites ° °L upper trapezius: 15 units- 3 sites °R upper trapezius: 15 units- 3 sits          °L paraspinal: 10 units- 2 sites °R paraspinal: 10 units- 2 sites ° °Face °L frontalis(2 injection sites):10 units   °R frontalis(2 injection sites):10 units         °L corrugator: 5 units   °R corrugator: 5 units           °Procerus: 5 units   °L temporalis: 20 units °R temporalis: 20 units  ° °Agent:  °200 units of botulinum Type  A (Onobotulinum Toxin type A) was reconstituted with 4 ml of preservative free normal saline.  °Time of reconstitution: At the time of the office visit (<30 minutes prior to injection)  ° ° ° Total injected (Units):  155 ° Total wasted (Units):  45 ° °Patient tolerated procedure well without complications.   °Reinjection is anticipated in 3 months. ° ° °

## 2023-02-16 ENCOUNTER — Encounter: Payer: Self-pay | Admitting: Neurology

## 2023-02-17 ENCOUNTER — Telehealth: Payer: Self-pay

## 2023-02-17 NOTE — Telephone Encounter (Signed)
Per Patient she was advised that the [place] needs a Prior Auth.  PA team please start a PA for Trudessa.

## 2023-02-23 ENCOUNTER — Other Ambulatory Visit (HOSPITAL_COMMUNITY): Payer: Self-pay

## 2023-02-24 ENCOUNTER — Encounter: Payer: Self-pay | Admitting: Neurology

## 2023-02-24 ENCOUNTER — Other Ambulatory Visit (HOSPITAL_COMMUNITY): Payer: Self-pay

## 2023-03-04 ENCOUNTER — Encounter: Payer: Self-pay | Admitting: Neurology

## 2023-03-10 ENCOUNTER — Encounter: Payer: Self-pay | Admitting: Neurology

## 2023-03-10 NOTE — Telephone Encounter (Signed)
Pt came in and inquired to see if the PA Appeal was sent in for the Nurtec?

## 2023-03-12 ENCOUNTER — Ambulatory Visit (INDEPENDENT_AMBULATORY_CARE_PROVIDER_SITE_OTHER): Payer: Commercial Managed Care - HMO

## 2023-03-12 ENCOUNTER — Other Ambulatory Visit (HOSPITAL_COMMUNITY): Payer: Self-pay

## 2023-03-12 DIAGNOSIS — G8929 Other chronic pain: Secondary | ICD-10-CM

## 2023-03-12 DIAGNOSIS — R519 Headache, unspecified: Secondary | ICD-10-CM | POA: Diagnosis not present

## 2023-03-12 MED ORDER — METOCLOPRAMIDE HCL 5 MG/ML IJ SOLN
10.0000 mg | Freq: Once | INTRAMUSCULAR | Status: AC
  Administered 2023-03-12: 10 mg via INTRAMUSCULAR

## 2023-03-12 MED ORDER — KETOROLAC TROMETHAMINE 60 MG/2ML IM SOLN
60.0000 mg | Freq: Once | INTRAMUSCULAR | Status: AC
  Administered 2023-03-12: 60 mg via INTRAMUSCULAR

## 2023-03-12 MED ORDER — DIPHENHYDRAMINE HCL 50 MG/ML IJ SOLN
50.0000 mg | Freq: Once | INTRAMUSCULAR | Status: AC
  Administered 2023-03-12: 25 mg via INTRAMUSCULAR

## 2023-03-12 NOTE — Telephone Encounter (Signed)
Test claim still rejecting. Will need to call to check status of appeal.

## 2023-03-15 ENCOUNTER — Encounter: Payer: Self-pay | Admitting: Neurology

## 2023-03-18 ENCOUNTER — Telehealth: Payer: Self-pay

## 2023-03-18 NOTE — Telephone Encounter (Signed)
PA needed for nurtec and Trudessa

## 2023-03-19 ENCOUNTER — Encounter: Payer: Self-pay | Admitting: Neurology

## 2023-03-20 ENCOUNTER — Other Ambulatory Visit: Payer: Self-pay

## 2023-03-20 ENCOUNTER — Emergency Department (HOSPITAL_BASED_OUTPATIENT_CLINIC_OR_DEPARTMENT_OTHER)
Admission: EM | Admit: 2023-03-20 | Discharge: 2023-03-20 | Disposition: A | Discharge status: Home / Self Care | Payer: Commercial Managed Care - HMO | Attending: Emergency Medicine | Admitting: Emergency Medicine

## 2023-03-20 ENCOUNTER — Encounter (HOSPITAL_BASED_OUTPATIENT_CLINIC_OR_DEPARTMENT_OTHER): Payer: Self-pay | Admitting: Emergency Medicine

## 2023-03-20 ENCOUNTER — Encounter: Payer: Self-pay | Admitting: Neurology

## 2023-03-20 DIAGNOSIS — G43909 Migraine, unspecified, not intractable, without status migrainosus: Secondary | ICD-10-CM | POA: Diagnosis present

## 2023-03-20 DIAGNOSIS — G43009 Migraine without aura, not intractable, without status migrainosus: Secondary | ICD-10-CM | POA: Insufficient documentation

## 2023-03-20 DIAGNOSIS — R030 Elevated blood-pressure reading, without diagnosis of hypertension: Secondary | ICD-10-CM | POA: Diagnosis not present

## 2023-03-20 LAB — CBC WITH DIFFERENTIAL/PLATELET
Abs Immature Granulocytes: 0.03 10*3/uL (ref 0.00–0.07)
Basophils Absolute: 0.1 10*3/uL (ref 0.0–0.1)
Basophils Relative: 1 %
Eosinophils Absolute: 0.2 10*3/uL (ref 0.0–0.5)
Eosinophils Relative: 3 %
HCT: 42 % (ref 36.0–46.0)
Hemoglobin: 13.6 g/dL (ref 12.0–15.0)
Immature Granulocytes: 0 %
Lymphocytes Relative: 30 %
Lymphs Abs: 2.2 10*3/uL (ref 0.7–4.0)
MCH: 30.6 pg (ref 26.0–34.0)
MCHC: 32.4 g/dL (ref 30.0–36.0)
MCV: 94.4 fL (ref 80.0–100.0)
Monocytes Absolute: 0.7 10*3/uL (ref 0.1–1.0)
Monocytes Relative: 10 %
Neutro Abs: 4.2 10*3/uL (ref 1.7–7.7)
Neutrophils Relative %: 56 %
Platelets: 351 10*3/uL (ref 150–400)
RBC: 4.45 MIL/uL (ref 3.87–5.11)
RDW: 13.2 % (ref 11.5–15.5)
WBC: 7.4 10*3/uL (ref 4.0–10.5)
nRBC: 0 % (ref 0.0–0.2)

## 2023-03-20 LAB — COMPREHENSIVE METABOLIC PANEL
ALT: 25 U/L (ref 0–44)
AST: 20 U/L (ref 15–41)
Albumin: 4.1 g/dL (ref 3.5–5.0)
Alkaline Phosphatase: 101 U/L (ref 38–126)
Anion gap: 9 (ref 5–15)
BUN: 12 mg/dL (ref 6–20)
CO2: 27 mmol/L (ref 22–32)
Calcium: 9 mg/dL (ref 8.9–10.3)
Chloride: 102 mmol/L (ref 98–111)
Creatinine, Ser: 0.85 mg/dL (ref 0.44–1.00)
GFR, Estimated: >60 mL/min (ref >60)
Glucose, Bld: 92 mg/dL (ref 70–99)
Potassium: 4.2 mmol/L (ref 3.5–5.1)
Sodium: 138 mmol/L (ref 135–145)
Total Bilirubin: 0.3 mg/dL (ref 0.3–1.2)
Total Protein: 8 g/dL (ref 6.5–8.1)

## 2023-03-20 MED ORDER — ONDANSETRON HCL 4 MG/2ML IJ SOLN
4.0000 mg | Freq: Once | INTRAMUSCULAR | Status: AC
  Administered 2023-03-20: 4 mg via INTRAVENOUS
  Filled 2023-03-20: qty 2

## 2023-03-20 MED ORDER — PROCHLORPERAZINE EDISYLATE 10 MG/2ML IJ SOLN
10.0000 mg | Freq: Once | INTRAMUSCULAR | Status: AC
  Administered 2023-03-20: 10 mg via INTRAVENOUS
  Filled 2023-03-20: qty 2

## 2023-03-20 MED ORDER — DIPHENHYDRAMINE HCL 50 MG/ML IJ SOLN
12.5000 mg | Freq: Once | INTRAMUSCULAR | Status: AC
  Administered 2023-03-20: 12.5 mg via INTRAVENOUS
  Filled 2023-03-20: qty 1

## 2023-03-20 MED ORDER — SODIUM CHLORIDE 0.9 % IV BOLUS
1000.0000 mL | Freq: Once | INTRAVENOUS | Status: AC
  Administered 2023-03-20: 1000 mL via INTRAVENOUS

## 2023-03-20 MED ORDER — DEXAMETHASONE SODIUM PHOSPHATE 10 MG/ML IJ SOLN
10.0000 mg | Freq: Once | INTRAMUSCULAR | Status: AC
  Administered 2023-03-20: 10 mg via INTRAVENOUS
  Filled 2023-03-20: qty 1

## 2023-03-20 MED ORDER — KETOROLAC TROMETHAMINE 30 MG/ML IJ SOLN
15.0000 mg | Freq: Once | INTRAMUSCULAR | Status: AC
  Administered 2023-03-20: 15 mg via INTRAVENOUS
  Filled 2023-03-20: qty 1

## 2023-03-20 NOTE — Discharge Instructions (Addendum)
You were seen in the emergency department for a migraine. Thankfully your labs appear normal and you responded well to the migraine medication. I would advise you follow up with your neurologist for further evaluation to adjust your migraine medications as they do not appear to be working well enough for you at this moment. If you feel that your symptoms are worsening or return, please return to the emergency department for further evaluation.  Please make sure you follow up with your primary care provider regarding your blood pressure since it was elevated here today. This may be due to the pain from the migraine but make sure you have some follow up.

## 2023-03-20 NOTE — ED Triage Notes (Signed)
Patient arrived via POV c/o migraine x 3 days. Patient states medications not working for this one. Patient is AO x 4, VS w/ elevated BP, normal gait.

## 2023-03-20 NOTE — ED Provider Notes (Signed)
[place] [facility] Provider Note   CSN: [id number] Arrival date & time: 03/20/23  2151     History Chief Complaint  Patient presents with   Migraine    Nichole Evans is a 47 y.o. female. Patient presented to the ED for a migraine. She reports that she is currently being managed by [facility] neurology but has been dealing with a persistent migraine not responding well to home medications for several days now. States that she has some photosensitivity, transient nausea, but denies any vomiting, eye pain, chest pain, abdominal pain, or confusion. She currently sees Dr. [name] at [facility]. Has recently been trying new medications for migraine while insurance is being sorted out for prior authorizations and has been relying on sample medications provided by neurology office.   Migraine Associated symptoms include headaches.       Home Medications Prior to Admission medications   Medication Sig Start Date End Date Taking? Authorizing Provider  ALPRAZolam (XANAX) 0.25 MG tablet Take 0.25 mg by mouth 3 (three) times daily as needed. 04/04/22   [provider]  botulinum toxin Type A (BOTOX) 200 units injection Inject 155 units IM into multiple site in the face,neck and head once every 90 days. Discard remainder 09/25/22   [name], DO  citalopram (CELEXA) 10 MG tablet Take 20 mg by mouth. 01/15/21   [provider]  Dihydroergotamine Mesylate HFA (TRUDHESA) 0.725 MG/ACT AERS Place 0.725 mg into the nose as needed. 10/02/22   [name], Adam R, DO  Galcanezumab-gnlm (EMGALITY) 120 MG/ML SOAJ Inject 120 mg into the skin every 28 (twenty-eight) days. 10/29/21   [name], DO  ibuprofen (ADVIL,MOTRIN) 800 MG tablet Take 1 tablet (800 mg total) by mouth 3 (three) times daily. 05/25/15   [name], PA-C  imipramine (TOFRANIL) 25 MG tablet Take 25 mg by mouth at bedtime. 03/13/22   [provider]  mometasone (NASONEX) 50  MCG/ACT nasal spray Place 2 sprays into the nose daily as needed (allergies).    [provider]  NURTEC 75 MG TBDP DISSOLVE 1 TABLET ON THE TONGUE DAILY AS NEEDED 02/01/23   [name], Adam R, DO  ondansetron (ZOFRAN) 8 MG tablet Take 8 mg by mouth 2 (two) times daily as needed. 02/24/22   [provider]  pantoprazole (PROTONIX) 40 MG tablet 1 tablet 03/05/20   [provider]  predniSONE (STERAPRED UNI-PAK 21 TAB) 10 MG (21) TBPK tablet take  day 1, then  day 2, then  day 3, then  day 4, then  day 5, then  day 6, then STOP 08/05/22   Jaffe, Adam R, DO  topiramate (TOPAMAX) 100 MG tablet Take 100 mg by mouth daily. 06/17/20   [provider]  Vitamin D, Ergocalciferol, (DRISDOL) 50000 units CAPS capsule Take 1 capsule (50,000 Units total) by mouth every 7 (seven) days. 03/04/18   [name], DO      Allergies    Ceclor [cefaclor] and Cephalexin    Review of Systems   Review of Systems  Neurological:  Positive for headaches.  All other systems reviewed and are negative.   Physical Exam Updated Vital Signs BP (!) 132/91   Pulse 89   Temp 97.6 F (36.4 C) (Oral)   Resp 17   Ht 5' (1.524 m)   Wt 63.5 kg   SpO2 95%   BMI 27.34 kg/m  Physical Exam Vitals and nursing note reviewed.  Constitutional:  General: She is not in acute distress.    Appearance: She is well-developed.  HENT:     Head: Normocephalic and atraumatic.  Eyes:     Conjunctiva/sclera: Conjunctivae normal.  Cardiovascular:     Rate and Rhythm: Normal rate and regular rhythm.     Heart sounds: No murmur heard. Pulmonary:     Effort: Pulmonary effort is normal. No respiratory distress.     Breath sounds: Normal breath sounds.  Abdominal:     Palpations: Abdomen is soft.     Tenderness: There is no abdominal tenderness.  Musculoskeletal:        General: No swelling.     Cervical back: Neck supple.  Skin:    General: Skin is warm and dry.     Capillary  Refill: Capillary refill takes less than 2 seconds.  Neurological:     General: No focal deficit present.     Mental Status: She is alert. Mental status is at baseline.     Cranial Nerves: No cranial nerve deficit.     Motor: No weakness.  Psychiatric:        Mood and Affect: Mood normal.     ED Results / Procedures / Treatments   Labs (all labs ordered are listed, but only abnormal results are displayed) Labs Reviewed  COMPREHENSIVE METABOLIC PANEL  CBC WITH DIFFERENTIAL/PLATELET    EKG None  Radiology No results found.  Procedures Procedures   Medications Ordered in ED Medications  sodium chloride 0.9 % bolus 1,000 mL (0 mLs Intravenous Stopped 03/20/23 2343)  prochlorperazine (COMPAZINE) injection 10 mg (10 mg Intravenous Given 03/20/23 2245)  ketorolac (TORADOL) 30 MG/ML injection 15 mg (15 mg Intravenous Given 03/20/23 2245)  ondansetron (ZOFRAN) injection 4 mg (4 mg Intravenous Given 03/20/23 2244)  diphenhydrAMINE (BENADRYL) injection 12.5 mg (12.5 mg Intravenous Given 03/20/23 2245)  dexamethasone (DECADRON) injection 10 mg (10 mg Intravenous Given 03/20/23 2346)    ED Course/ Medical Decision Making/ A&P                           Medical Decision Making Amount and/or Complexity of Data Reviewed Labs: ordered.  Risk Prescription drug management.   This patient presents to the ED for concern of migraine.  Differential diagnosis includes complex migraine, intractable migraine, tension headaches, brain tumor   Medicines ordered and prescription drug management:  I ordered medication including fluids, Compazine, Toradol, Benadryl, Zofran, Decadron for migraine cocktail Reevaluation of the patient after these medicines showed that the patient improved I have reviewed the patients home medicines and have made adjustments as needed   Problem List / ED Course:  Patient presents emergency department complaint of headache.  She reports that she has been  experiencing a migraine that has been somewhat intractable nature for the last 3 days.  States that her home medications not currently working.  She is currently being seen by [facility] neurology and states that she has been try to figure out insurance coverage for some medications and has been relying on sample medications prior to by the neurology office.  Based on my assessment, patient not appear to have any acute neurological deficits or findings consistent with a stroke and so treatment initiated for a migraine.  Patient had significant response to this migraine cocktail and reports that pain was reduced all the way down to a 0.  Advised patient to follow-up with neurology office for further evaluation and to return to the emergency  department she feels like her symptoms return or worsening.  At this time do not believe that is necessary to have any further imaging or other testing done as patient is hemodynamically stable and has improved her condition with migraine cocktail. Patient is agreeable to treatment plan and verbalized understanding all return precautions. All questions answered prior to patient discharge. Did also advise patient of elevated BP reading and to follow up with PCP for further evaluation given no prior history of diagnosed hypertension. Likely secondary to migraine but advised patient that follow up would be beneficial in case that HTN is causing symptoms.  Final Clinical Impression(s) / ED Diagnoses Final diagnoses:  Migraine without aura and without status migrainosus, not intractable  Elevated blood pressure reading    Rx / DC Orders ED Discharge Orders     None         Smitty Knudsen, PA-C 03/20/23 2350    Melene Plan, DO 03/21/23 1459

## 2023-03-22 ENCOUNTER — Other Ambulatory Visit: Payer: Self-pay | Admitting: Neurology
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
## 2023-03-23 ENCOUNTER — Encounter: Payer: Self-pay | Admitting: Neurology

## 2023-03-23 ENCOUNTER — Telehealth: Payer: Commercial Managed Care - HMO | Admitting: Neurology

## 2023-03-23 DIAGNOSIS — G43009 Migraine without aura, not intractable, without status migrainosus: Secondary | ICD-10-CM | POA: Diagnosis not present

## 2023-03-23 MED ORDER — SUMATRIPTAN SUCCINATE 6 MG/0.5ML ~~LOC~~ SOAJ: 6.0000 mg | SUBCUTANEOUS | 11 refills | Status: DC | PRN

## 2023-03-23 MED ORDER — QULIPTA 60 MG PO TABS
60.0000 mg | ORAL_TABLET | Freq: Every day | ORAL | 11 refills | Status: DC
Start: 2023-03-23 — End: 2023-07-28

## 2023-03-24 ENCOUNTER — Telehealth: Payer: Self-pay

## 2023-03-24 ENCOUNTER — Encounter: Payer: Self-pay | Admitting: Neurology

## 2023-03-24 NOTE — Telephone Encounter (Signed)
Per patient Pa needed for Sumatriptan injection.

## 2023-03-29 ENCOUNTER — Encounter: Payer: Self-pay | Admitting: Neurology

## 2023-03-31 ENCOUNTER — Other Ambulatory Visit (HOSPITAL_COMMUNITY): Payer: Self-pay

## 2023-03-31 NOTE — Telephone Encounter (Signed)
Per test claim Nurtec is covered for 16 tablets per 22 days.   PA for Nichole Evans was previously denied due to:   There is nothing to support that for the following covered alternative: generic dihydroergotamine nasal spray either: 1. The individual has had inadequate efficacy to the covered alternative; or 2. The individual has a contraindication according to FDA label, significant intolerance, or is not a candidate* for the covered alternative. *Note: Not a candidate due to being subject to a warning per the prescribing information (labeling), having a disease characteristic, individual clinical factor[s], other attributes/conditions, or is unable to administer and requires this dosage  formulation.Trudhesa (dihydroergotamine mesylate) 0.725 mg/spray nasal spray is considered medically necessary when there is documentation of one of the following: 1. The individual has had inadequate efficacy to the covered alternative: generic dihydroergotamine nasal spray; or 2. The individual has a contraindication according to FDA label, significant intolerance, or is not a candidate* for the covered alternative [*Not a candidate due to being subject to a warning per the prescribing information (labeling), having a disease characteristic, individual clinical factor(s), other attributes/conditions, or is unable to administer and requires this dosage formulation].

## 2023-03-31 NOTE — Telephone Encounter (Signed)
PA has been resubmitted for Qulipta 60MG  tablets to [facility] via CMM and has been APPROVED from 03/31/2023-03/29/2024  Key: [id number]

## 2023-03-31 NOTE — Telephone Encounter (Signed)
Pt insurance pays for 4ml per 30 days. Called pt pharmacy [facility]) and they were able to process 4 ml/30 days with a $0 copay.

## 2023-03-31 NOTE — Telephone Encounter (Signed)
Spoke to pharmacy as well as Dr.Jaffe stated this is a new medication no way the claim read too soon to refill.  Claim re ran by rep at [facility], Per Test claim Exceeds plan limit Please call [phone].

## 2023-03-31 NOTE — Telephone Encounter (Signed)
Per test claim: PA not needed, refill too soon until 04/23/2023.

## 2023-03-31 NOTE — Telephone Encounter (Signed)
Patient advised.

## 2023-04-01 ENCOUNTER — Other Ambulatory Visit: Payer: Self-pay | Admitting: Neurology

## 2023-04-01 NOTE — Telephone Encounter (Signed)
LMOVm for patient.  

## 2023-04-24 ENCOUNTER — Ambulatory Visit (HOSPITAL_COMMUNITY)
Admission: EM | Admit: 2023-04-24 | Discharge: 2023-04-24 | Disposition: A | Discharge status: Home / Self Care | Payer: Commercial Managed Care - HMO | Attending: Urology | Admitting: Urology

## 2023-04-24 ENCOUNTER — Encounter (HOSPITAL_COMMUNITY): Payer: Self-pay | Admitting: Registered Nurse

## 2023-04-24 DIAGNOSIS — F1094 Alcohol use, unspecified with alcohol-induced mood disorder: Secondary | ICD-10-CM | POA: Diagnosis not present

## 2023-04-24 DIAGNOSIS — F419 Anxiety disorder, unspecified: Secondary | ICD-10-CM | POA: Insufficient documentation

## 2023-04-24 DIAGNOSIS — R45851 Suicidal ideations: Secondary | ICD-10-CM | POA: Diagnosis not present

## 2023-04-24 DIAGNOSIS — G8929 Other chronic pain: Secondary | ICD-10-CM | POA: Insufficient documentation

## 2023-04-24 DIAGNOSIS — G43909 Migraine, unspecified, not intractable, without status migrainosus: Secondary | ICD-10-CM | POA: Diagnosis not present

## 2023-04-24 DIAGNOSIS — F332 Major depressive disorder, recurrent severe without psychotic features: Secondary | ICD-10-CM | POA: Diagnosis present

## 2023-04-24 DIAGNOSIS — F429 Obsessive-compulsive disorder, unspecified: Secondary | ICD-10-CM | POA: Insufficient documentation

## 2023-04-24 DIAGNOSIS — Z9151 Personal history of suicidal behavior: Secondary | ICD-10-CM | POA: Insufficient documentation

## 2023-04-24 LAB — CBC WITH DIFFERENTIAL/PLATELET
Abs Immature Granulocytes: 0.04 10*3/uL (ref 0.00–0.07)
Basophils Absolute: 0.1 10*3/uL (ref 0.0–0.1)
Basophils Relative: 1 %
Eosinophils Absolute: 0.1 10*3/uL (ref 0.0–0.5)
Eosinophils Relative: 1 %
HCT: 47.8 % — ABNORMAL HIGH (ref 36.0–46.0)
Hemoglobin: 15.5 g/dL — ABNORMAL HIGH (ref 12.0–15.0)
Immature Granulocytes: 0 %
Lymphocytes Relative: 26 %
Lymphs Abs: 2.6 10*3/uL (ref 0.7–4.0)
MCH: 30.8 pg (ref 26.0–34.0)
MCHC: 32.4 g/dL (ref 30.0–36.0)
MCV: 95 fL (ref 80.0–100.0)
Monocytes Absolute: 0.5 10*3/uL (ref 0.1–1.0)
Monocytes Relative: 6 %
Neutro Abs: 6.5 10*3/uL (ref 1.7–7.7)
Neutrophils Relative %: 66 %
Platelets: 416 10*3/uL — ABNORMAL HIGH (ref 150–400)
RBC: 5.03 MIL/uL (ref 3.87–5.11)
RDW: 13.6 % (ref 11.5–15.5)
WBC: 9.8 10*3/uL (ref 4.0–10.5)
nRBC: 0 % (ref 0.0–0.2)

## 2023-04-24 LAB — POCT URINE DRUG SCREEN - MANUAL ENTRY (I-SCREEN)
POC Amphetamine UR: NOT DETECTED
POC Buprenorphine (BUP): NOT DETECTED
POC Cocaine UR: NOT DETECTED
POC Marijuana UR: POSITIVE — AB
POC Methadone UR: NOT DETECTED
POC Methamphetamine UR: NOT DETECTED
POC Morphine: NOT DETECTED
POC Oxazepam (BZO): NOT DETECTED
POC Oxycodone UR: NOT DETECTED
POC Secobarbital (BAR): NOT DETECTED

## 2023-04-24 LAB — COMPREHENSIVE METABOLIC PANEL
ALT: 21 U/L (ref 0–44)
AST: 21 U/L (ref 15–41)
Albumin: 4.6 g/dL (ref 3.5–5.0)
Alkaline Phosphatase: 93 U/L (ref 38–126)
Anion gap: 14 (ref 5–15)
BUN: 7 mg/dL (ref 6–20)
CO2: 25 mmol/L (ref 22–32)
Calcium: 9.9 mg/dL (ref 8.9–10.3)
Chloride: 103 mmol/L (ref 98–111)
Creatinine, Ser: 0.77 mg/dL (ref 0.44–1.00)
GFR, Estimated: >60 mL/min (ref >60)
Glucose, Bld: 110 mg/dL — ABNORMAL HIGH (ref 70–99)
Potassium: 4.6 mmol/L (ref 3.5–5.1)
Sodium: 142 mmol/L (ref 135–145)
Total Bilirubin: 0.6 mg/dL (ref 0.3–1.2)
Total Protein: 8.3 g/dL — ABNORMAL HIGH (ref 6.5–8.1)

## 2023-04-24 LAB — LIPID PANEL
Cholesterol: 313 mg/dL — ABNORMAL HIGH (ref 0–200)
HDL: 77 mg/dL (ref >40)
LDL Cholesterol: 180 mg/dL — ABNORMAL HIGH (ref 0–99)
Total CHOL/HDL Ratio: 4.1 RATIO
Triglycerides: 280 mg/dL — ABNORMAL HIGH (ref <150)
VLDL: 56 mg/dL — ABNORMAL HIGH (ref 0–40)

## 2023-04-24 LAB — ETHANOL: Alcohol, Ethyl (B): 143 mg/dL — ABNORMAL HIGH (ref <10)

## 2023-04-24 LAB — POCT PREGNANCY, URINE: Preg Test, Ur: NEGATIVE

## 2023-04-24 LAB — POC URINE PREG, ED: Preg Test, Ur: NEGATIVE

## 2023-04-24 LAB — TSH: TSH: 2.395 u[IU]/mL (ref 0.350–4.500)

## 2023-04-24 MED ORDER — ACETAMINOPHEN 325 MG PO TABS: 650.0000 mg | ORAL_TABLET | Freq: Four times a day (QID) | ORAL | Status: DC | PRN

## 2023-04-24 MED ORDER — ALUM & MAG HYDROXIDE-SIMETH 200-200-20 MG/5ML PO SUSP: 30.0000 mL | ORAL | Status: DC | PRN

## 2023-04-24 MED ORDER — MAGNESIUM HYDROXIDE 400 MG/5ML PO SUSP: 30.0000 mL | Freq: Every day | ORAL | Status: DC | PRN

## 2023-04-24 MED ORDER — HYDROXYZINE HCL 25 MG PO TABS
50.0000 mg | ORAL_TABLET | Freq: Once | ORAL | Status: AC
  Administered 2023-04-24: 50 mg via ORAL
  Filled 2023-04-24: qty 2

## 2023-04-24 NOTE — Discharge Instructions (Signed)
Safety Plan Nichole Evans will reach out to Nichole Evans, call 911 or call mobile crisis, or go to nearest emergency room if condition worsens or if suicidal thoughts become active Patients' will follow up with [facility] for Cognitive Behavior Therapy for outpatient psychiatric services (therapy/medication management).  The suicide prevention education provided includes the following: Suicide risk factors Suicide prevention and interventions National Suicide Hotline telephone number [facility] assessment telephone number [facility] Emergency Assistance 911 [facility] and/or Residential Mobile Crisis Unit telephone number Request made of family/significant other to:  Nichole Evans and [employer] (e.g., guns, rifles, knives), all items previously/currently identified as safety concern.   Remove drugs/medications (over the counter, prescriptions, illicit drugs), all items previously/currently identified as a safety concern.
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
## 2023-04-24 NOTE — ED Notes (Signed)
Patient to unit.  Wants to see provider who came to bedside.  Patient is now denying any suicidal thoughts.  Provider states that she does not feel comfortable sending her home and will have to IVC the patient if she wants to leave.  Patient agrees to stay voluntary at this time so she can avoid having to stay the 72 hour window.  Will continue to monitor for safety

## 2023-04-24 NOTE — ED Provider Notes (Signed)
FBC/OBS ASAP Discharge Summary  Date and Time: 04/24/2023 1:06 PM  Name: Nichole Evans  MRN:  [id number]   Discharge Diagnoses:  Final diagnoses:  Severe episode of recurrent major depressive disorder, without psychotic features (HCC)  Alcohol-induced mood disorder with depressive symptoms (HCC)    Subjective: "I was drunk"  Stay Summary:  Nichole Evans is a 47 year old female with a self-reported psychiatric history of depression, anxiety, OCD, and PTSD was admitted to continuous assessment after presenting to [facility] voluntary vial law enforcement with complaints of suicidal ideation.   Nichole Evans was admitted for Alcohol-induced mood disorder with depressive symptoms (HCC), crisis management, safety, and stabilization.   Nichole Evans seen face to face by this provider for psychiatric reassessment, consulted with Dr. Nelly Rout; and chart reviewed on 04/24/23.  On evaluation Nichole Evans reports she is feeling better this morning.  States that she was intoxicated last night "I had been drinking and one thing just led to another.  I do have a lot of stress related to chronic pain but I don't want to kill myself.  I just want to go home and cuddle with my dog."  Patient denies suicidal/self-harm/homicidal ideation, psychosis, and paranoia.  Patient states that there is a gun in the home but she doesn't have access to it.  Patient states she slept well last night.  Reports she takes medications as prescribed with no adverse reaction and is eating without difficulty.  Patient states she has outpatient psychiatric services. Patient states that she lives with [name] and gave permission to speak to [facility] for collateral information.   During evaluation Nichole Evans is sitting up in bed with no noted distress.  She is alert/oriented x 4, calm, cooperative, attentive, and responses were relevant and appropriate to assessment questions. She spoke in a clear tone at moderate volume, and normal pace,  with good eye contact.   She denies suicidal/self-harm/homicidal ideation, psychosis, and paranoia.  Objectively:  there is no evidence of psychosis/mania or delusional thinking.  She conversed coherently, with goal directed thoughts, and no distractibility, or pre-occupation. Nichole Evans is instructed to call 911 and present to the nearest emergency room should she experience any suicidal/homicidal ideation, auditory/visual/hallucinations, or detrimental worsening of her mental health condition.  She was advised to follow up with current psychiatric provider  Collateral Information:  Spoke to [name] [phone].  John states that he has no worries and doesn't feel that patient is a danger to herself.  Reports he does have a gun but patient doesn't have access to it.  States this has happened before when patient gets intoxicated and then makes suicidal statement.  Reports he will be with patient and is able to pick up.     Home medications were restarted: Current Facility-Administered Medications for the 04/24/23 encounter [facility] Encounter)  Medication   botulinum toxin Type A (BOTOX) injection 200 Units   Current Meds  Medication Sig   Atogepant (QULIPTA) 60 MG TABS Take 1 tablet (60 mg total) by mouth daily.   citalopram (CELEXA) 10 MG tablet Take 20 mg by mouth.   ibuprofen (ADVIL,MOTRIN) 800 MG tablet Take 1 tablet (800 mg total) by mouth 3 (three) times daily.   mometasone (NASONEX) 50 MCG/ACT nasal spray Place 2 sprays into the nose daily as needed (allergies).   pantoprazole (PROTONIX) 40 MG tablet Take 40 mg by mouth daily.   PROCTO-MED HC 2.5 % rectal cream Apply 1 Application topically 2 (two)  times daily. For Psoriasis on the trunk and back of the body   SUMAtriptan 6 MG/0.5ML SOAJ Inject 6 mg into the skin as needed. May repeat after 1 hour.  Maximum 2 injections in 24 hours.   traMADol (ULTRAM) 50 MG tablet Take 50 mg by mouth every 6 (six) hours as needed for moderate pain.    Vitamin D, Ergocalciferol, (DRISDOL) 50000 units CAPS capsule Take 1 capsule (50,000 Units total) by mouth every 7 (seven) days.     Labs ordered for review:  Routine labs:  Lab Orders         CBC with Differential/Platelet         Comprehensive metabolic panel         Hemoglobin A1c         Ethanol         Lipid panel         TSH         POC urine preg, ED         POCT Urine Drug Screen - (I-Screen)         Pregnancy, urine POC     Improvement was monitored by staff observation and clinical report along with Nichole Evans 's verbal report of emotional status and symptom reduction.  Upon completion of this admission Nichole Evans was both mentally and medically stable for discharge denying suicidal/homicidal ideation, auditory/visual/tactile hallucinations, delusional thoughts, and paranoia.    Nichole Evans was evaluated by the treatment team for stability and plans for continued recovery upon discharge. Nichole Evans 's motivation was an integral factor for scheduling further treatment. Employment, transportation, bed availability, health status, family support, and any pending legal issues were also considered during stay. She was offered further treatment options upon discharge including but not limited to Residential, Intensive Outpatient, and Outpatient treatment.   Nichole Evans will follow up with  Follow-up Information     Call  Pilar Grammes.   Contact information: [facility] Call to reschudle appointment if need to be seen prior to scheduled appointment                 Discharge Instructions      Safety Plan Nichole Evans will reach out to [name], call 911 or call mobile crisis, or go to nearest emergency room if condition worsens or if suicidal thoughts become active Patients' will follow up with [facility] for outpatient psychiatric services (therapy/medication management).  The suicide  prevention education provided includes the following: Suicide risk factors Suicide prevention and interventions National Suicide Hotline telephone number [facility] assessment telephone number [facility] Emergency Assistance 911 [facility] and/or Residential Mobile Crisis Unit telephone number Request made of family/significant other to:  [employer]chie [employer] and [employer] (e.g., guns, rifles, knives), all items previously/currently identified as safety concern.   Remove drugs/medications (over the counter, prescriptions, illicit drugs), all items previously/currently identified as a safety concern.        Total Time spent with patient: 45 minutes  Past Psychiatric History:  Patient Active Problem List   Diagnosis Date Noted   Alcohol-induced mood disorder with depressive symptoms (HCC) 04/24/2023   MDD (major depressive disorder), recurrent severe, without psychosis (HCC) 04/24/2023   Chronic headaches 02/01/2018    Past Medical History:  Past Medical History:  Diagnosis Date   Chicken pox    Depression    Eating disorder    GERD (gastroesophageal  reflux disease)    Glaucoma    Hay fever    Heart murmur    Migraines     Family History: History reviewed. No pertinent family history.   None reported Family Psychiatric History: None reported Social History:  Social History   Tobacco Use   Smoking status: Every Day    Packs/day: .5    Types: Cigarettes   Smokeless tobacco: Never  Vaping Use   Vaping Use: Never used  Substance Use Topics   Alcohol use: Yes    Alcohol/week: 2.0 standard drinks of alcohol    Types: 2 Glasses of wine per week   Drug use: No    Tobacco Cessation:  A prescription for an FDA-approved tobacco cessation medication was offered at discharge and the patient refused  Current Medications:  Current Facility-Administered Medications  Medication Dose Route Frequency Provider Last Rate Last Admin   acetaminophen (TYLENOL)  tablet 650 mg  650 mg Oral Q6H PRN Ajibola, Ene A, NP       alum & mag hydroxide-simeth (MAALOX/MYLANTA) 200-200-20 MG/5ML suspension 30 mL  30 mL Oral Q4H PRN Ajibola, Ene A, NP       botulinum toxin Type A (BOTOX) injection 200 Units  200 Units Intramuscular Once Jaffe, Adam R, DO       magnesium hydroxide (MILK OF MAGNESIA) suspension 30 mL  30 mL Oral Daily PRN Ajibola, Ene A, NP       Current Outpatient Medications  Medication Sig Dispense Refill   Atogepant (QULIPTA) 60 MG TABS Take 1 tablet (60 mg total) by mouth daily. 30 tablet 11   citalopram (CELEXA) 10 MG tablet Take 20 mg by mouth.     ibuprofen (ADVIL,MOTRIN) 800 MG tablet Take 1 tablet (800 mg total) by mouth 3 (three) times daily. 21 tablet 0   mometasone (NASONEX) 50 MCG/ACT nasal spray Place 2 sprays into the nose daily as needed (allergies).     pantoprazole (PROTONIX) 40 MG tablet Take 40 mg by mouth daily.     PROCTO-MED HC 2.5 % rectal cream Apply 1 Application topically 2 (two) times daily. For Psoriasis on the trunk and back of the body     SUMAtriptan 6 MG/0.5ML SOAJ Inject 6 mg into the skin as needed. May repeat after 1 hour.  Maximum 2 injections in 24 hours. 5 mL 11   traMADol (ULTRAM) 50 MG tablet Take 50 mg by mouth every 6 (six) hours as needed for moderate pain.     Vitamin D, Ergocalciferol, (DRISDOL) 50000 units CAPS capsule Take 1 capsule (50,000 Units total) by mouth every 7 (seven) days. 12 capsule 0   botulinum toxin Type A (BOTOX) 200 units injection Inject 155 units IM into multiple site in the face,neck and head once every 90 days. Discard remainder 1 each 4   Galcanezumab-gnlm (EMGALITY) 120 MG/ML SOAJ Inject 120 mg into the skin every 28 (twenty-eight) days. (Patient not taking: Reported on 04/24/2023) 1.12 mL 5   ondansetron (ZOFRAN) 8 MG tablet Take 8 mg by mouth 2 (two) times daily as needed.     predniSONE (STERAPRED UNI-PAK 21 TAB) 10 MG (21) TBPK tablet take 60mg  day 1, then 50mg  day 2, then 40mg   day 3, then 30mg  day 4, then 20mg  day 5, then 10mg  day 6, then STOP (Patient not taking: Reported on 04/24/2023) 21 tablet 0    PTA Medications:  PTA Medications  Medication Sig   mometasone (NASONEX) 50 MCG/ACT nasal spray Place 2 sprays into  the nose daily as needed (allergies).   ibuprofen (ADVIL,MOTRIN) 800 MG tablet Take 1 tablet (800 mg total) by mouth 3 (three) times daily.   Vitamin D, Ergocalciferol, (DRISDOL) 50000 units CAPS capsule Take 1 capsule (50,000 Units total) by mouth every 7 (seven) days.   citalopram (CELEXA) 10 MG tablet Take 20 mg by mouth.   pantoprazole (PROTONIX) 40 MG tablet Take 40 mg by mouth daily.   Atogepant (QULIPTA) 60 MG TABS Take 1 tablet (60 mg total) by mouth daily.   SUMAtriptan 6 MG/0.5ML SOAJ Inject 6 mg into the skin as needed. May repeat after 1 hour.  Maximum 2 injections in 24 hours.   PROCTO-MED HC 2.5 % rectal cream Apply 1 Application topically 2 (two) times daily. For Psoriasis on the trunk and back of the body   traMADol (ULTRAM) 50 MG tablet Take 50 mg by mouth every 6 (six) hours as needed for moderate pain.   Galcanezumab-gnlm (EMGALITY) 120 MG/ML SOAJ Inject 120 mg into the skin every 28 (twenty-eight) days. (Patient not taking: Reported on 04/24/2023)   ondansetron (ZOFRAN) 8 MG tablet Take 8 mg by mouth 2 (two) times daily as needed.   predniSONE (STERAPRED UNI-PAK 21 TAB) 10 MG (21) TBPK tablet take 60mg  day 1, then 50mg  day 2, then 40mg  day 3, then 30mg  day 4, then 20mg  day 5, then 10mg  day 6, then STOP (Patient not taking: Reported on 04/24/2023)   botulinum toxin Type A (BOTOX) 200 units injection Inject 155 units IM into multiple site in the face,neck and head once every 90 days. Discard remainder   Facility Ordered Medications  Medication   botulinum toxin Type A (BOTOX) injection 200 Units   acetaminophen (TYLENOL) tablet 650 mg   alum & mag hydroxide-simeth (MAALOX/MYLANTA) 200-200-20 MG/5ML suspension 30 mL   magnesium hydroxide  (MILK OF MAGNESIA) suspension 30 mL   [COMPLETED] hydrOXYzine (ATARAX) tablet 50 mg        No data to display          Flowsheet Row ED from 04/24/2023 in [facility] ED from 03/20/2023 in [facility] Emergency Department at [facility] ED from 09/28/2022 in [facility] Emergency Department at [facility]  C-SSRS RISK CATEGORY High Risk No Risk No Risk       Musculoskeletal  Strength & Muscle Tone: within normal limits Gait & Station: normal Patient leans: N/A  Psychiatric Specialty Exam  Presentation  General Appearance:  Appropriate for Environment  Eye Contact: Good  Speech: Clear and Coherent; Normal Rate  Speech Volume: Normal  Handedness: Right   Mood and Affect  Mood: Anxious; Euthymic  Affect: Appropriate; Congruent   Thought Process  Thought Processes: Coherent; Goal Directed  Descriptions of Associations:Intact  Orientation:Full (Time, Place and Person)  Thought Content:Logical  Diagnosis of Schizophrenia or Schizoaffective disorder in past: No    Hallucinations:Hallucinations: None  Ideas of Reference:None  Suicidal Thoughts:Suicidal Thoughts: No SI Active Intent and/or Plan: With Plan  Homicidal Thoughts:Homicidal Thoughts: No   Sensorium  Memory: Immediate Good; Recent Good; Remote Good  Judgment: Intact  Insight: Present; Good   Executive Functions  Concentration: Good  Attention Span: Good  Recall: Good  Fund of Knowledge: Good  Language: Good   Psychomotor Activity  Psychomotor Activity: Psychomotor Activity: Normal   Assets  Assets: Communication Skills; Desire for Improvement; Financial Resources/Insurance; Housing; Leisure Time; Resilience; Social Support   Sleep  Sleep: Sleep: Fair Number of Hours of Sleep: 3  Nutritional Assessment (For OBS and FBC admissions only) Has the patient had a weight loss or gain of 10 pounds or more in the last  3 months?: No Has the patient had a decrease in food intake/or appetite?: No Does the patient have dental problems?: No Does the patient have eating habits or behaviors that may be indicators of an eating disorder including binging or inducing vomiting?: No Has the patient recently lost weight without trying?: 0 Has the patient been eating poorly because of a decreased appetite?: 0 Malnutrition Screening Tool Score: 0    Physical Exam  Physical Exam Vitals and nursing note reviewed.  Constitutional:      General: She is not in acute distress.    Appearance: Normal appearance. She is not ill-appearing.  HENT:     Head: Normocephalic.  Eyes:     Conjunctiva/sclera: Conjunctivae normal.  Cardiovascular:     Rate and Rhythm: Normal rate.  Pulmonary:     Effort: Pulmonary effort is normal.  Musculoskeletal:        General: Normal range of motion.     Cervical back: Normal range of motion.  Skin:    General: Skin is warm and dry.  Neurological:     Mental Status: She is alert and oriented to person, place, and time.  Psychiatric:        Attention and Perception: Attention and perception normal. She does not perceive auditory or visual hallucinations.        Mood and Affect: Mood and affect normal.        Speech: Speech normal.        Behavior: Behavior normal. Behavior is cooperative.        Thought Content: Thought content normal. Thought content is not paranoid or delusional. Thought content does not include homicidal or suicidal ideation.        Cognition and Memory: Cognition normal.        Judgment: Judgment is impulsive.    Review of Systems  Constitutional:        No other verbal complaints  Psychiatric/Behavioral:  Positive for depression (Stable) and substance abuse. Hallucinations: Denies. Suicidal ideas: Denies.The patient is nervous/anxious (Stable). Insomnia: Slept well last night.  All other systems reviewed and are negative.  Blood pressure 127/86, pulse (!)  112, temperature 98.3 F (36.8 C), temperature source Oral, resp. rate 16, SpO2 98 %. There is no height or weight on file to calculate BMI.  Demographic Factors:  Caucasian and Female  Loss Factors: NA  Historical Factors: Impulsivity  Risk Reduction Factors:   Living with another person, especially a relative, Positive social support, and Positive therapeutic relationship  Continued Clinical Symptoms:  Previous Psychiatric Diagnoses and Treatments  Cognitive Features That Contribute To Risk:  None    Suicide Risk:  Minimal: No identifiable suicidal ideation.  Patients presenting with no risk factors but with morbid ruminations; may be classified as minimal risk based on the severity of the depressive symptoms  Plan Of Care/Follow-up recommendations:  Other:  Follow up with current psychiatric provider  Disposition: No evidence of imminent risk to self or others at present.   Patient does not meet criteria for psychiatric inpatient admission. Supportive therapy provided about ongoing stressors. Discussed crisis plan, support from social network, calling 911, coming to the Emergency Department, and calling Suicide Hotline.  Shermon Bozzi, NP 04/24/2023, 1:06 PM

## 2023-04-24 NOTE — Progress Notes (Signed)
   04/24/23 0114  [facility] Triage Screening (Walk-ins at [facility] only)  How Did You Hear About [place]? Legal System  What Is the Reason for Your Visit/Call Today? Pt [facility] to [facility] voluntarily, accompanied by [employer] with complaint of suicidal ideation with a plan to shoot herself with a gun. Pt reports that she has a medical condition that has caused her chronic pain over the last six years and she is struggling to get through each day. Pt has history of depression and chronic pain sydrome along with migraines. Pt admits that her significant other does have a gun in the home that she would have access to. Pt is prescribed Citalapram (20mg ) and last dose was yesterday. Pt is also linked to Pilar Grammes at [facility] for Cognitive Behavior Therapy. Pt currently denies HI, AVH.  How Long Has This Been Causing You Problems? > than 6 months  Have You Recently Had Any Thoughts About Hurting Yourself? Yes  How long ago did you have thoughts about hurting yourself? currently  Are You Planning to Commit Suicide/Harm Yourself At This time? Yes  Have you Recently Had Thoughts About Hurting Someone Karolee Ohs? No  Are You Planning To Harm Someone At This Time? No  Are you currently experiencing any auditory, visual or other hallucinations? No  Have You Used Any Alcohol or Drugs in the Past 24 Hours? Yes  How long ago did you use Drugs or Alcohol? earlier tonight  What Did You Use and How Much? 2 mixed cocktails  Do you have any current medical co-morbidities that require immediate attention? No  Clinician description of patient physical appearance/behavior: Pt is calm, cooperative, but has flat affect  What Do You Feel Would Help You the Most Today? Treatment for Depression or other mood problem;Social Support  If access to [facility] was not available, would you have sought care in the Emergency Department? Yes  Determination of Need Emergent (2 hours)  Options For Referral [facility];Other: Comment;Outpatient  Therapy;Medication Management;Inpatient Hospitalization

## 2023-04-24 NOTE — ED Notes (Signed)
Patient resting with no sxs of distress at this time - will continue to monitor for safety ?

## 2023-04-27 LAB — HEMOGLOBIN A1C
Hgb A1c MFr Bld: 5.9 % — ABNORMAL HIGH (ref 4.8–5.6)
Mean Plasma Glucose: 123 mg/dL

## 2023-04-30 ENCOUNTER — Telehealth: Payer: Self-pay | Admitting: Pharmacy Technician

## 2023-04-30 ENCOUNTER — Other Ambulatory Visit (HOSPITAL_COMMUNITY): Payer: Self-pay

## 2023-04-30 NOTE — Telephone Encounter (Signed)
Patient Advocate Encounter  Received notification from [facility]) that prior authorization for SUMATRIPTAN 6MG  is required.   PA submitted on 5.31.24 VIA PHONE PHONE: [phone] FAX: [phone] Key [id number] Status is pending

## 2023-05-01 ENCOUNTER — Other Ambulatory Visit (HOSPITAL_COMMUNITY): Payer: Self-pay

## 2023-05-07 ENCOUNTER — Other Ambulatory Visit (HOSPITAL_COMMUNITY): Payer: Self-pay

## 2023-05-07 ENCOUNTER — Other Ambulatory Visit: Payer: Self-pay | Admitting: Neurology

## 2023-05-07 MED ORDER — SUMATRIPTAN SUCCINATE 6 MG/0.5ML ~~LOC~~ SOAJ: 6.0000 mg | SUBCUTANEOUS | 11 refills | Status: DC | PRN

## 2023-05-07 NOTE — Telephone Encounter (Signed)
Patient Advocate Encounter   Received notification from [employer] that prior authorization is required for SUMAtriptan Succinate 6MG /0.5ML auto-injectors  Prior authorization is CANCELLED due to: This drug is covered with a quantity limit. Insurance pays MAX of in 30 days.

## 2023-05-14 ENCOUNTER — Ambulatory Visit: Payer: Commercial Managed Care - HMO | Admitting: Neurology

## 2023-05-14 DIAGNOSIS — G43009 Migraine without aura, not intractable, without status migrainosus: Secondary | ICD-10-CM

## 2023-05-14 MED ORDER — ONABOTULINUMTOXINA 100 UNITS IJ SOLR
200.0000 [IU] | Freq: Once | INTRAMUSCULAR | Status: AC
Start: 2023-05-14 — End: 2023-05-14
  Administered 2023-05-14: 155 [IU] via INTRAMUSCULAR

## 2023-05-14 NOTE — Progress Notes (Signed)
Botulinum Clinic  ° °Procedure Note Botox ° °Attending: Dr. Sonjia Wilcoxson ° °Preoperative Diagnosis(es): Chronic migraine ° °Consent obtained from: The patient °Benefits discussed included, but were not limited to decreased muscle tightness, increased joint range of motion, and decreased pain.  Risk discussed included, but were not limited pain and discomfort, bleeding, bruising, excessive weakness, venous thrombosis, muscle atrophy and dysphagia.  Anticipated outcomes of the procedure as well as he risks and benefits of the alternatives to the procedure, and the roles and tasks of the personnel to be involved, were discussed with the patient, and the patient consents to the procedure and agrees to proceed. A copy of the patient medication guide was given to the patient which explains the blackbox warning. ° °Patients identity and treatment sites confirmed Yes.  . ° °Details of Procedure: °Skin was cleaned with alcohol. Prior to injection, the needle plunger was aspirated to make sure the needle was not within a blood vessel.  There was no blood retrieved on aspiration.   ° °Following is a summary of the muscles injected  And the amount of Botulinum toxin used: ° °Dilution °200 units of Botox was reconstituted with 4 ml of preservative free normal saline. °Time of reconstitution: At the time of the office visit (<30 minutes prior to injection)  ° °Injections  °155 total units of Botox was injected with a 30 gauge needle. ° °Injection Sites: °L occipitalis: 15 units- 3 sites  °R occiptalis: 15 units- 3 sites ° °L upper trapezius: 15 units- 3 sites °R upper trapezius: 15 units- 3 sits          °L paraspinal: 10 units- 2 sites °R paraspinal: 10 units- 2 sites ° °Face °L frontalis(2 injection sites):10 units   °R frontalis(2 injection sites):10 units         °L corrugator: 5 units   °R corrugator: 5 units           °Procerus: 5 units   °L temporalis: 20 units °R temporalis: 20 units  ° °Agent:  °200 units of botulinum Type  A (Onobotulinum Toxin type A) was reconstituted with 4 ml of preservative free normal saline.  °Time of reconstitution: At the time of the office visit (<30 minutes prior to injection)  ° ° ° Total injected (Units):  155 ° Total wasted (Units):  45 ° °Patient tolerated procedure well without complications.   °Reinjection is anticipated in 3 months. ° ° °

## 2023-07-28 ENCOUNTER — Other Ambulatory Visit: Payer: Self-pay

## 2023-07-28 ENCOUNTER — Encounter: Payer: Self-pay | Admitting: Neurology

## 2023-07-28 MED ORDER — QULIPTA 60 MG PO TABS: 60.0000 mg | ORAL_TABLET | Freq: Every day | ORAL | 2 refills | Status: AC

## 2023-07-28 MED ORDER — SUMATRIPTAN SUCCINATE 6 MG/0.5ML ~~LOC~~ SOAJ: 6.0000 mg | SUBCUTANEOUS | 2 refills | Status: AC | PRN

## 2023-07-28 NOTE — Telephone Encounter (Signed)
Per Patient, Hello Dr. [name],   I have moved to [place]. I will be sending in a refill request for Qulipta and Sumatriptan injections. I will more than likely need a prior authorization through my new insurance. Is that something you can do?  I am almost out and my appointment woth a PCP is Sept 12th.    Also, I will need to cancel all upcoming appointments.    Thank you so much for your support and help with my condition.    Nichole Evans    Refills sent to last to her appt.

## 2023-08-13 ENCOUNTER — Ambulatory Visit: Payer: Commercial Managed Care - HMO | Admitting: Neurology

## 2023-08-25 ENCOUNTER — Encounter: Payer: Self-pay | Admitting: Neurology

## 2023-08-25 ENCOUNTER — Telehealth: Payer: Self-pay

## 2023-08-25 DIAGNOSIS — G43009 Migraine without aura, not intractable, without status migrainosus: Secondary | ICD-10-CM

## 2023-10-06 ENCOUNTER — Other Ambulatory Visit (HOSPITAL_COMMUNITY): Payer: Self-pay

## 2023-10-27 ENCOUNTER — Ambulatory Visit: Payer: Commercial Managed Care - HMO | Admitting: Neurology

## 2023-12-29 NOTE — Telephone Encounter (Signed)
Referral added

## 2024-03-28 ENCOUNTER — Telehealth: Payer: Self-pay | Admitting: Pharmacy Technician

## 2024-03-28 ENCOUNTER — Other Ambulatory Visit (HOSPITAL_COMMUNITY): Payer: Self-pay

## 2024-03-28 NOTE — Telephone Encounter (Signed)
 noted

## 2024-03-28 NOTE — Telephone Encounter (Signed)
 Pharmacy Patient Advocate Encounter   Received notification from CoverMyMeds that prior authorization for QULIPTA  60MG  is required/requested.   Insurance verification completed.   The patient is insured through [employer] .   Per test claim:

## 2024-06-23 ENCOUNTER — Other Ambulatory Visit (HOSPITAL_COMMUNITY): Payer: Self-pay

## 2024-11-21 ENCOUNTER — Other Ambulatory Visit: Payer: Self-pay
